# Patient Record
Sex: Male | Born: 1950
Health system: Southern US, Community
[De-identification: ages and names within clinical notes are randomized; demographics above are authoritative.]

## PROBLEM LIST (undated history)

## (undated) DIAGNOSIS — N529 Male erectile dysfunction, unspecified: Secondary | ICD-10-CM

## (undated) HISTORY — PX: TUMOR EXCISION: SHX421

## (undated) HISTORY — PX: HIP SURGERY: SHX245

## (undated) HISTORY — PX: TONSILLECTOMY: SUR1361

## (undated) HISTORY — PX: BACK SURGERY: SHX140

## (undated) HISTORY — PX: TOTAL HIP ARTHROPLASTY: SHX124

---

## 1997-09-12 ENCOUNTER — Encounter: Admission: RE | Admit: 1997-09-12 | Discharge: 1997-09-12 | Payer: Self-pay | Admitting: Family Medicine

## 2003-02-11 ENCOUNTER — Encounter: Admission: RE | Admit: 2003-02-11 | Discharge: 2003-02-27 | Payer: Self-pay | Admitting: Family Medicine

## 2008-06-27 ENCOUNTER — Encounter (INDEPENDENT_AMBULATORY_CARE_PROVIDER_SITE_OTHER): Payer: Self-pay | Admitting: *Deleted

## 2010-06-24 ENCOUNTER — Other Ambulatory Visit: Payer: Self-pay | Admitting: Dermatology

## 2011-04-15 ENCOUNTER — Other Ambulatory Visit: Payer: Self-pay | Admitting: Dermatology

## 2011-09-07 ENCOUNTER — Encounter (HOSPITAL_BASED_OUTPATIENT_CLINIC_OR_DEPARTMENT_OTHER): Payer: Self-pay | Admitting: *Deleted

## 2011-09-07 ENCOUNTER — Emergency Department (HOSPITAL_BASED_OUTPATIENT_CLINIC_OR_DEPARTMENT_OTHER)
Admission: EM | Admit: 2011-09-07 | Discharge: 2011-09-07 | Disposition: A | Discharge status: Home / Self Care | Payer: BC Managed Care – PPO | Attending: Emergency Medicine | Admitting: Emergency Medicine

## 2011-09-07 DIAGNOSIS — IMO0002 Reserved for concepts with insufficient information to code with codable children: Secondary | ICD-10-CM | POA: Insufficient documentation

## 2011-09-07 DIAGNOSIS — T148XXA Other injury of unspecified body region, initial encounter: Secondary | ICD-10-CM

## 2011-09-07 DIAGNOSIS — Z79899 Other long term (current) drug therapy: Secondary | ICD-10-CM | POA: Insufficient documentation

## 2011-09-07 DIAGNOSIS — J45909 Unspecified asthma, uncomplicated: Secondary | ICD-10-CM | POA: Insufficient documentation

## 2011-09-07 DIAGNOSIS — Y9373 Activity, racquet and hand sports: Secondary | ICD-10-CM | POA: Insufficient documentation

## 2011-09-07 DIAGNOSIS — Y9239 Other specified sports and athletic area as the place of occurrence of the external cause: Secondary | ICD-10-CM | POA: Insufficient documentation

## 2011-09-07 DIAGNOSIS — S0500XA Injury of conjunctiva and corneal abrasion without foreign body, unspecified eye, initial encounter: Secondary | ICD-10-CM

## 2011-09-07 DIAGNOSIS — S058X9A Other injuries of unspecified eye and orbit, initial encounter: Secondary | ICD-10-CM | POA: Insufficient documentation

## 2011-09-07 HISTORY — DX: Male erectile dysfunction, unspecified: N52.9

## 2011-09-07 MED ORDER — FLUORESCEIN SODIUM 1 MG OP STRP
ORAL_STRIP | OPHTHALMIC | Status: AC
  Administered 2011-09-07: 1
  Filled 2011-09-07: qty 1

## 2011-09-07 MED ORDER — CIPROFLOXACIN HCL 0.3 % OP SOLN
OPHTHALMIC | Status: DC
Start: 2011-09-07 — End: 2011-09-08
  Filled 2011-09-07: qty 2.5

## 2011-09-07 MED ORDER — TETRACAINE HCL 0.5 % OP SOLN
OPHTHALMIC | Status: AC
  Administered 2011-09-07: 21:00:00
  Filled 2011-09-07: qty 2

## 2011-09-07 MED ORDER — FLUORESCEIN SODIUM 1 MG OP STRP: 1.0000 | ORAL_STRIP | Freq: Once | OPHTHALMIC | Status: DC

## 2011-09-07 MED ORDER — TETRACAINE HCL 0.5 % OP SOLN: 2.0000 [drp] | Freq: Once | OPHTHALMIC | Status: DC

## 2011-09-07 MED ORDER — TETANUS-DIPHTH-ACELL PERTUSSIS 5-2.5-18.5 LF-MCG/0.5 IM SUSP
INTRAMUSCULAR | Status: AC
  Administered 2011-09-07: 0.5 mL via INTRAMUSCULAR
  Filled 2011-09-07: qty 0.5

## 2011-09-07 MED ORDER — CIPROFLOXACIN HCL 0.3 % OP SOLN
2.0000 [drp] | OPHTHALMIC | Status: DC
  Administered 2011-09-07: 2 [drp] via OPHTHALMIC

## 2011-09-07 NOTE — Discharge Instructions (Signed)
Corneal Abrasion The cornea is the clear covering at the front and center of the eye. It is a thin tissue made up of layers. The top layer is the most sensitive layer. A corneal abrasion happens if this layer is scratched or an injury causes it to come off.  HOME CARE  You may be given drops or a medicated cream. Use the medicine as told by your doctor.   A pressure patch may be put over the eye. If this is done, follow your doctor's instructions for when to remove the patch. Do not drive or use machines while the eye patch is on. Judging distances is hard to do with a patch on.   See your doctor for a follow-up exam if you are told to do so.  GET HELP RIGHT AWAY IF:   The pain is getting worse or is very bad.   The eye is very sensitive to light.   Any liquid comes out of the injured eye after treatment.   Your vision suddenly gets worse.   You have a sudden loss of vision or blindness.  MAKE SURE YOU:   Understand these instructions.   Will watch your condition.   Will get help right away if you are not doing well or get worse.  Document Released: 09/29/2007 Document Revised: 04/01/2011 Document Reviewed: 09/29/2007 [employer] Patient Information 2012 [employer]. 

## 2011-09-07 NOTE — ED Provider Notes (Signed)
History     CSN: [id number]  Arrival date & time 09/07/11  4098   First MD Initiated Contact with Patient 09/07/11 1955      Chief Complaint  Patient presents with  . Eye Injury    Left eye hit by raquet ball this day.    (Consider location/radiation/quality/duration/timing/severity/associated sxs/prior treatment) HPI Comments: Pt states that he was playing racquet ball and the ball hit his goggles and he initially felt like he had a scrape to the eye but it is starting to feel better:pt states that he has a history of optic neuritis and he is seeing at what is baseline is which isn't very much  Patient is a 61 y.o. male presenting with eye injury. The history is provided by the patient. No language interpreter was used.  Eye Injury This is a new problem. The current episode started today. The problem occurs constantly. The problem has been unchanged. The symptoms are aggravated by nothing. He has tried nothing for the symptoms.    Past Medical History  Diagnosis Date  . Erectile dysfunction   . Gout   . Asthma     Past Surgical History  Procedure Date  . 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 Tonsillectomy     History reviewed. No pertinent family history.  History  Substance Use Topics  . Smoking status: Never Smoker   . Smokeless tobacco: Not on file  . Alcohol Use: Yes      Review of Systems  Respiratory: Negative.   Cardiovascular: Negative.   Neurological: Negative.     Allergies  Review of patient's allergies indicates no known allergies.  Home Medications   Current Outpatient Rx  Name Route Sig Dispense Refill  . ALBUTEROL SULFATE HFA 108 (90 BASE) MCG/ACT IN AERS Inhalation Inhale 2 puffs into the lungs every 6 (six) hours as needed. For cough or shortness of breath    . ALLOPURINOL 100 MG PO TABS Oral Take 100 mg by mouth daily.    . COLCHICINE 0.6 MG PO TABS Oral Take 0.6 mg by mouth 2 (two) times daily as needed.  For gout flares    . ESOMEPRAZOLE MAGNESIUM 40 MG PO CPDR Oral Take 40 mg by mouth daily.    Marland Kitchen FLUTICASONE PROPIONATE 50 MCG/ACT NA SUSP Nasal Place 2 sprays into the nose daily.    Marland Kitchen FLUTICASONE-SALMETEROL 100-50 MCG/DOSE IN AEPB Inhalation Inhale 1 puff into the lungs every 12 (twelve) hours.    Marland Kitchen GLUCOSAMINE CHONDR COMPLEX PO Oral Take 2 tablets by mouth at bedtime.    . IBUPROFEN 200 MG PO TABS Oral Take 600-800 mg by mouth once as needed. For pain    . ADULT MULTIVITAMIN W/MINERALS CH Oral Take 1 tablet by mouth daily.    Marland Kitchen FISH OIL 1200 MG PO CAPS Oral Take 3 capsules by mouth at bedtime.    Marland Kitchen SILDENAFIL CITRATE 25 MG PO TABS Oral Take 25 mg by mouth daily as needed.    Marland Kitchen VITAMIN E PO Oral Take 1 capsule by mouth at bedtime.      BP 161/71  Pulse 84  Temp(Src) 98.1 F (36.7 C) (Oral)  Resp 20  Ht 6\' 2"  (1.88 m)  Wt 240 lb (108.863 kg)  BMI 30.81 kg/m2  SpO2 97%  Physical Exam  Nursing note and vitals reviewed. Constitutional: He appears well-developed and well-nourished.  HENT:  Right Ear: External ear normal.  Left Ear: External  ear normal.  Eyes: EOM are normal. Pupils are equal, round, and reactive to light. Left conjunctiva is injected.    Cardiovascular: Normal rate and regular rhythm.   Skin:       Pt has an abrasion to the left bridge of the nose and the left eye lid    ED Course  Procedures (including critical care time)  Labs Reviewed - No data to display No results found.   1. Corneal abrasion   2. Abrasion       MDM  Pt has no visual change from baseline:pt has abrasion:pt is not a contact wearer;pt given drops here with follow up instructions        Teressa Lower, NP 09/07/11 2044

## 2011-09-07 NOTE — ED Notes (Signed)
Pt. Reports optic neuritis in the L eye and he has "hardly any vision in the L eye".  Pt. Does wear glasses.

## 2011-09-07 NOTE — ED Provider Notes (Signed)
Medical screening examination/treatment/procedure(s) were performed by non-physician practitioner and as supervising physician I was immediately available for consultation/collaboration.   Matteus Mcnelly, MD 09/07/11 2209 

## 2013-08-15 ENCOUNTER — Encounter: Payer: Self-pay | Admitting: Internal Medicine

## 2013-12-04 ENCOUNTER — Other Ambulatory Visit: Payer: Self-pay | Admitting: Gastroenterology

## 2014-01-11 ENCOUNTER — Encounter: Payer: Self-pay | Admitting: Internal Medicine

## 2016-01-22 ENCOUNTER — Ambulatory Visit (INDEPENDENT_AMBULATORY_CARE_PROVIDER_SITE_OTHER): Payer: BLUE CROSS/BLUE SHIELD | Admitting: Podiatry

## 2016-01-22 ENCOUNTER — Encounter: Payer: Self-pay | Admitting: Podiatry

## 2016-01-22 ENCOUNTER — Ambulatory Visit (INDEPENDENT_AMBULATORY_CARE_PROVIDER_SITE_OTHER): Payer: BLUE CROSS/BLUE SHIELD

## 2016-01-22 VITALS — BP 177/103 | HR 73 | Resp 16 | Ht 74.0 in | Wt 260.0 lb

## 2016-01-22 DIAGNOSIS — M79671 Pain in right foot: Secondary | ICD-10-CM | POA: Diagnosis not present

## 2016-01-22 DIAGNOSIS — M1000 Idiopathic gout, unspecified site: Secondary | ICD-10-CM

## 2016-01-22 DIAGNOSIS — M2040 Other hammer toe(s) (acquired), unspecified foot: Secondary | ICD-10-CM

## 2016-01-22 DIAGNOSIS — M779 Enthesopathy, unspecified: Secondary | ICD-10-CM

## 2016-01-22 MED ORDER — METHYLPREDNISOLONE 4 MG PO TBPK: ORAL_TABLET | ORAL | 0 refills | Status: DC

## 2016-01-22 MED ORDER — TRIAMCINOLONE ACETONIDE 10 MG/ML IJ SUSP
10.0000 mg | Freq: Once | INTRAMUSCULAR | Status: AC
  Administered 2016-01-22: 10 mg

## 2016-01-22 NOTE — Progress Notes (Signed)
   Subjective:    Patient ID: Danny Soto, male    DOB: August 09, 1950, 65 y.o.   MRN: [id number]  HPI  Chief Complaint  Patient presents with  . Toe Pain    R great joint redness, swelling, warmth and pain x 5 days..."gout flair up."   . Toe Pain    R great toe "bone spur" sticking out and rubbing 2nd toe causing pain.       Review of Systems  All other systems reviewed and are negative.      Objective:   Physical Exam        Assessment & Plan:

## 2016-01-22 NOTE — Progress Notes (Signed)
Subjective:     Patient ID: Danny Soto, male   DOB: [birth year], 65 y.o.   MRN: [id number]  HPI patient states my right foot has become inflamed around the big toe joint and I have trouble off and on with my second toes with keratotic lesions that are gradually becoming more of a problem   Review of Systems  All other systems reviewed and are negative.      Objective:   Physical Exam  Constitutional: He is oriented to person, place, and time.  Cardiovascular: Intact distal pulses.   Musculoskeletal: Normal range of motion.  Neurological: He is oriented to person, place, and time.  Skin: Skin is warm.  Nursing note and vitals reviewed.  neurovascular status intact muscle strength adequate range of motion within normal limits with patient noted to have inflammation and pain around the first MPJ right with fluid buildup medial and reduced range of motion. There is deviation the hallux against the second toe bilateral with keratotic lesions between the hallux and second digit and patient is noted to have good digital perfusion and is well oriented 3     Assessment:     Structural changes first MPJ with hammertoe deformity and possibility for gout or other condition along with hallux limitus    Plan:     H&P and conditions reviewed with patient and today I went ahead and injected the first MPJ capsule 3 mg Kenalog 5 mill grams Xylocaine and educated on gout and hallux limitus deformity and also possibility for hammertoe correction and dispensed pads for the second digits bilateral and placed on Medrol Dosepak. Reappoint to recheck  X-ray report was negative for signs of fracture but did indicate some lysis or medial aspect first metatarsal head right and deviation of the hallux against the second toe

## 2016-01-26 ENCOUNTER — Ambulatory Visit: Payer: Self-pay | Admitting: Podiatry

## 2016-02-12 ENCOUNTER — Ambulatory Visit (INDEPENDENT_AMBULATORY_CARE_PROVIDER_SITE_OTHER): Payer: BLUE CROSS/BLUE SHIELD | Admitting: Podiatry

## 2016-02-12 ENCOUNTER — Encounter: Payer: Self-pay | Admitting: Podiatry

## 2016-02-12 DIAGNOSIS — M205X9 Other deformities of toe(s) (acquired), unspecified foot: Secondary | ICD-10-CM | POA: Diagnosis not present

## 2016-02-12 DIAGNOSIS — M2040 Other hammer toe(s) (acquired), unspecified foot: Secondary | ICD-10-CM

## 2016-02-12 DIAGNOSIS — M779 Enthesopathy, unspecified: Secondary | ICD-10-CM | POA: Diagnosis not present

## 2016-02-12 NOTE — Progress Notes (Signed)
Subjective:     Patient ID: Danny Soto, male   DOB: 12-16-1950, 65 y.o.   MRN: [id number]  HPI patient states that what was done for him has helped quite a bit but he does have chronic problems with the big toe joint right over left   Review of Systems     Objective:   Physical Exam Neurovascular status intact with continued reduction of range of motion of the significant nature right over left with inflammatory changes consistent around the joint surface with(discomfort that has reduced quite well    Assessment:     Inflammatory hallux limitus with capsulitis with probability also of gout as precipitating factor    Plan:     H&P conditions reviewed and at this time I went ahead and I discussed long-term orthotics to try to provide support and hopefully prevent surgery. Patient is placed in orthotics at the current time and was scanned and will be seen back when ready and we are making a Berkley type device with deep heel seat

## 2016-03-11 ENCOUNTER — Encounter: Payer: BLUE CROSS/BLUE SHIELD | Admitting: Podiatry

## 2016-04-28 ENCOUNTER — Ambulatory Visit: Payer: BLUE CROSS/BLUE SHIELD

## 2016-04-28 DIAGNOSIS — M779 Enthesopathy, unspecified: Secondary | ICD-10-CM

## 2016-04-28 NOTE — Patient Instructions (Signed)

## 2018-04-14 ENCOUNTER — Other Ambulatory Visit: Payer: Self-pay | Admitting: Podiatry

## 2018-04-14 ENCOUNTER — Ambulatory Visit: Payer: BLUE CROSS/BLUE SHIELD | Admitting: Podiatry

## 2018-04-14 ENCOUNTER — Encounter: Payer: Self-pay | Admitting: Podiatry

## 2018-04-14 ENCOUNTER — Ambulatory Visit (INDEPENDENT_AMBULATORY_CARE_PROVIDER_SITE_OTHER): Payer: BLUE CROSS/BLUE SHIELD

## 2018-04-14 DIAGNOSIS — M2012 Hallux valgus (acquired), left foot: Secondary | ICD-10-CM

## 2018-04-14 DIAGNOSIS — M79672 Pain in left foot: Secondary | ICD-10-CM

## 2018-04-14 DIAGNOSIS — M2042 Other hammer toe(s) (acquired), left foot: Secondary | ICD-10-CM | POA: Diagnosis not present

## 2018-04-14 DIAGNOSIS — L84 Corns and callosities: Secondary | ICD-10-CM | POA: Diagnosis not present

## 2018-04-14 DIAGNOSIS — M2010 Hallux valgus (acquired), unspecified foot: Secondary | ICD-10-CM | POA: Diagnosis not present

## 2018-04-14 MED ORDER — DOXYCYCLINE HYCLATE 100 MG PO TABS: 100.0000 mg | ORAL_TABLET | Freq: Two times a day (BID) | ORAL | 0 refills | Status: DC

## 2018-04-15 NOTE — Progress Notes (Signed)
Subjective:   Patient ID: Danny Soto, male   DOB: 67 y.o.   MRN: [id number]   HPI Patient presents with very painful lesion second digit left with structural changes of the big toe second toe with abutment of the hallux against the second digit.  Patient states is been present for about 4 months and he has been trying to soak it without relief and is been on an antibiotic which seemed to make a difference short-term.  Patient states it is very sore and he currently does not smoke and likes to be active   ROS      Objective:  Physical Exam  Neurovascular status intact muscle strength is adequate patient found to have keratotic lesion that does have moderate breakdown of tissue second digit left with significant abutment of the hallux and second toe with large spur formation that is painful when pressed.  Patient does have good digital perfusion is well oriented x3 with no proximal edema erythema or drainage noted     Assessment:  Chronic low-grade ulceration of the left second digit with structural bunion deformity hammertoe deformity noted and lesion on the big toe also     Plan:  H&P and education concerning condition along with x-rays reviewed with patient.  At this point I do think most likely this will require surgery with the way that this is acting but at this point I want to get this healthier before I would consider that.  I did go ahead today I applied a thick padding to the area to try to separate the 2 toes with a donut type device and I applied Iodosorb to dry this area out advised on soaks continued padding usage and placed on doxycycline for another 2 weeks.  Reappoint 3 weeks and if we can get this healthier we will consider bunion correction spur removal and arthroplasty procedure second digit  X-rays indicate that there is significant structural malalignment between the hallux and the second digit left with no indications of osteomyelitic changes

## 2018-05-04 ENCOUNTER — Ambulatory Visit: Payer: BLUE CROSS/BLUE SHIELD | Admitting: Podiatry

## 2018-05-04 ENCOUNTER — Ambulatory Visit (INDEPENDENT_AMBULATORY_CARE_PROVIDER_SITE_OTHER): Payer: BLUE CROSS/BLUE SHIELD

## 2018-05-04 ENCOUNTER — Encounter: Payer: Self-pay | Admitting: Podiatry

## 2018-05-04 ENCOUNTER — Other Ambulatory Visit: Payer: Self-pay | Admitting: Podiatry

## 2018-05-04 DIAGNOSIS — M2042 Other hammer toe(s) (acquired), left foot: Secondary | ICD-10-CM

## 2018-05-04 DIAGNOSIS — M2010 Hallux valgus (acquired), unspecified foot: Secondary | ICD-10-CM

## 2018-05-04 DIAGNOSIS — M79672 Pain in left foot: Secondary | ICD-10-CM

## 2018-05-04 DIAGNOSIS — D169 Benign neoplasm of bone and articular cartilage, unspecified: Secondary | ICD-10-CM

## 2018-05-04 MED ORDER — DOXYCYCLINE HYCLATE 100 MG PO TABS: 100.0000 mg | ORAL_TABLET | Freq: Two times a day (BID) | ORAL | 0 refills | Status: DC

## 2018-05-04 NOTE — Patient Instructions (Signed)
Pre-Operative Instructions  Congratulations, you have decided to take an important step towards improving your quality of life.  You can be assured that the doctors and staff at [facility] will be with you every step of the way.  Here are some important things you should know:  1. Plan to be at the surgery center/hospital at least 1 (one) hour prior to your scheduled time, unless otherwise directed by the surgical center/hospital staff.  You must have a responsible adult accompany you, remain during the surgery and drive you home.  Make sure you have directions to the surgical center/hospital to ensure you arrive on time. 2. If you are having surgery at [facility] or [place] [facility] hospitals, you will need a copy of your medical history and physical form from your family physician within one month prior to the date of surgery. We will give you a form for your primary physician to complete.  3. We make every effort to accommodate the date you request for surgery.  However, there are times where surgery dates or times have to be moved.  We will contact you as soon as possible if a change in schedule is required.   4. No aspirin/ibuprofen for one week before surgery.  If you are on aspirin, any non-steroidal anti-inflammatory medications (Mobic, Aleve, Ibuprofen) should not be taken seven (7) days prior to your surgery.  You make take Tylenol for pain prior to surgery.  5. Medications - If you are taking daily heart and blood pressure medications, seizure, reflux, allergy, asthma, anxiety, pain or diabetes medications, make sure you notify the surgery center/hospital before the day of surgery so they can tell you which medications you should take or avoid the day of surgery. 6. No food or drink after midnight the night before surgery unless directed otherwise by surgical center/hospital staff. 7. No alcoholic beverages 24-hours prior to surgery.  No smoking 24-hours prior or 24-hours after  surgery. 8. Wear loose pants or shorts. They should be loose enough to fit over bandages, boots, and casts. 9. Don't wear slip-on shoes. Sneakers are preferred. 10. Bring your boot with you to the surgery center/hospital.  Also bring crutches or a walker if your physician has prescribed it for you.  If you do not have this equipment, it will be provided for you after surgery. 11. If you have not been contacted by the surgery center/hospital by the day before your surgery, call to confirm the date and time of your surgery. 12. Leave-time from work may vary depending on the type of surgery you have.  Appropriate arrangements should be made prior to surgery with your employer. 13. Prescriptions will be provided immediately following surgery by your doctor.  Fill these as soon as possible after surgery and take the medication as directed. Pain medications will not be refilled on weekends and must be approved by the doctor. 14. Remove nail polish on the operative foot and avoid getting pedicures prior to surgery. 15. Wash the night before surgery.  The night before surgery wash the foot and leg well with water and the antibacterial soap provided. Be sure to pay special attention to beneath the toenails and in between the toes.  Wash for at least three (3) minutes. Rinse thoroughly with water and dry well with a towel.  Perform this wash unless told not to do so by your physician.  Enclosed: 1 Ice pack (please put in freezer the night before surgery)   1 Hibiclens skin cleaner     Pre-op instructions  If you have any questions regarding the instructions, please do not hesitate to call our office.  [place]: [address], [place], [place] [address] -- [phone]  [place]: [address] , [place] [address] -- [phone]  [place]: [address]  [place], [place] [address] -- [phone]  [place]: [address], [address], [place], [place] [address] -- [phone]  Website:  [email or web address] 

## 2018-05-04 NOTE — Progress Notes (Signed)
Subjective:   Patient ID: Danny Soto, male   DOB: 68 y.o.   MRN: [id number]   HPI Patient presents growing a spot between her big toe and second toe continues to not heal even though it is not red anymore or draining but it still painful and I do have this bunion and I know I need to get these corrected and it is been going on for fairly long time   ROS      Objective:  Physical Exam  Neurovascular status intact with patient found to have a continued open area on the medial side of the interphalangeal joint digit to left with structural bunion deformity with pressure between the hallux and second toe.  Patient does have good digital perfusion is well oriented x3 and has had this for over 5 months and we have done antibiotic treatment which is been effective in reducing any redness but it continues to be irritating and he wants it fixed     Assessment:  Hammertoe deformity left second toe with hypertrophy condyle left hallux and structural bunion deformity left with pressure between the 2 with chronic breakdown of tissue     Plan:  H&P conditions reviewed and at this point I do think surgical intervention is indicated.  I recommended arthroplasty along with condylectomy and structural bunion correction and I allowed him to read consent form going over all possible alternative treatments complications.  I did discuss there is absolutely no long-term guarantees that this area will heal and may ultimately require amputation of the second digit but this is the most likely way that we will build to get this better surgically and for the long-term.  He is completely understanding of this wants surgery signed consent form after extensive review and is scheduled for outpatient surgery and has air fracture walker dispensed with all instructions on usage.  Patient is encouraged to call us with any questions concerns he may have prior to procedure and I did dispense air fracture walker today and he  understands total recovery will be around 6 months  X-rays taken today does not indicate there is any ostial lysis or any indications of bone pathology of the second digit except for the chronic compression between the hallux and second toe

## 2018-05-05 ENCOUNTER — Telehealth: Payer: Self-pay | Admitting: *Deleted

## 2018-05-05 NOTE — Telephone Encounter (Signed)
Per Dr. Paulla Dolly, I attempted to call the patient and offer him January 14 for his surgery date.  I left him a message to call me back.  We presently have him scheduled for January 21.

## 2018-05-09 ENCOUNTER — Encounter: Payer: Self-pay | Admitting: Podiatry

## 2018-05-09 DIAGNOSIS — M25775 Osteophyte, left foot: Secondary | ICD-10-CM

## 2018-05-09 DIAGNOSIS — M2042 Other hammer toe(s) (acquired), left foot: Secondary | ICD-10-CM

## 2018-05-09 DIAGNOSIS — M2012 Hallux valgus (acquired), left foot: Secondary | ICD-10-CM

## 2018-05-16 NOTE — Telephone Encounter (Signed)
Danny Soto returned my call and we scheduled him for surgery on May 09, 2018.

## 2018-05-17 ENCOUNTER — Encounter: Payer: Self-pay | Admitting: Podiatry

## 2018-05-17 ENCOUNTER — Ambulatory Visit (INDEPENDENT_AMBULATORY_CARE_PROVIDER_SITE_OTHER): Payer: BLUE CROSS/BLUE SHIELD | Admitting: Podiatry

## 2018-05-17 ENCOUNTER — Ambulatory Visit (INDEPENDENT_AMBULATORY_CARE_PROVIDER_SITE_OTHER): Payer: BLUE CROSS/BLUE SHIELD

## 2018-05-17 DIAGNOSIS — M2010 Hallux valgus (acquired), unspecified foot: Secondary | ICD-10-CM

## 2018-05-17 DIAGNOSIS — M2012 Hallux valgus (acquired), left foot: Secondary | ICD-10-CM

## 2018-05-17 DIAGNOSIS — M2042 Other hammer toe(s) (acquired), left foot: Secondary | ICD-10-CM

## 2018-05-17 DIAGNOSIS — D169 Benign neoplasm of bone and articular cartilage, unspecified: Secondary | ICD-10-CM

## 2018-05-17 DIAGNOSIS — Z09 Encounter for follow-up examination after completed treatment for conditions other than malignant neoplasm: Secondary | ICD-10-CM

## 2018-05-17 NOTE — Progress Notes (Signed)
Subjective:   Patient ID: Danny Soto, male   DOB: 68 y.o.   MRN: [id number]   HPI Patient presents stating of doing very well with my left foot with minimal discomfort and unable to walk and I have been back at work   ROS      Objective:  Physical Exam  Neurovascular status intact with patient's left foot healing well with wound edges well coapted hallux in rectus position second digit good alignment with crusted tissue which I checked the toe appears to be healing very well with no redness drainage or erythema noted     Assessment:  Doing well post forefoot surgery left with a previous ulceration left second digit that appears to be healing well     Plan:  H&P x-rays reviewed and at this point I went ahead and reapplied sterile dressing instructed on continued elevation compression immobilization and reappoint 2 weeks of stitch removal or earlier if needed  X-ray indicates osteotomies healing very well digit in good alignment with satisfactory resection of condyle left hallux

## 2018-05-31 ENCOUNTER — Encounter: Payer: Self-pay | Admitting: Podiatry

## 2018-05-31 ENCOUNTER — Ambulatory Visit (INDEPENDENT_AMBULATORY_CARE_PROVIDER_SITE_OTHER): Payer: BLUE CROSS/BLUE SHIELD | Admitting: Podiatry

## 2018-05-31 ENCOUNTER — Ambulatory Visit (INDEPENDENT_AMBULATORY_CARE_PROVIDER_SITE_OTHER): Payer: BLUE CROSS/BLUE SHIELD

## 2018-05-31 DIAGNOSIS — M2010 Hallux valgus (acquired), unspecified foot: Secondary | ICD-10-CM

## 2018-05-31 DIAGNOSIS — D169 Benign neoplasm of bone and articular cartilage, unspecified: Secondary | ICD-10-CM | POA: Diagnosis not present

## 2018-05-31 DIAGNOSIS — M2042 Other hammer toe(s) (acquired), left foot: Secondary | ICD-10-CM

## 2018-05-31 DIAGNOSIS — Z09 Encounter for follow-up examination after completed treatment for conditions other than malignant neoplasm: Secondary | ICD-10-CM | POA: Diagnosis not present

## 2018-05-31 NOTE — Progress Notes (Signed)
This patient presents the office for an evaluation of his left foot following surgery by Dr. Paulla Dolly.    He says he is not having much pain or discomfort but his second toe does have soreness..   Patient says he needed surgery since he had continual breakdown of the skin second toe near the big toe.  Patient had an [place] bunionectomy 1st MPJ with kwire fixation, condylectomy left hallux and hammer toe second left foot.  Patient is walking with his surgical shoe.  Surgery was performed 3 weeks ago and his sutures were removed today.    Objective  Neurovascular status is intact.  Healing noted at the incision sites left foot.  Mild masceration noted along lateral dorsal incision.  No redness or drainage or infection from surgical sites. Good alignment of first ray.  Mild deviation noted second toe.  Ulcer on second toe is resolving in the absence of infection. Limited ROM 1st MPJ  Left.    S/P foot surgery  ROV.  X-rays reveal normal alignment with normal fixation 1st MPJ left.  Resection of hallux laterally is noted.  Absence of medial aspect base middle phalanx as well as head proximal phalanx second toe left foot.  Told patient to start moving big toe joint.  Call the office as needed.  RTC  2 weeks.  Continue using surgical shoe to ambulate.   Gardiner Barefoot DPM

## 2018-06-28 ENCOUNTER — Ambulatory Visit (INDEPENDENT_AMBULATORY_CARE_PROVIDER_SITE_OTHER): Payer: BLUE CROSS/BLUE SHIELD

## 2018-06-28 ENCOUNTER — Ambulatory Visit (INDEPENDENT_AMBULATORY_CARE_PROVIDER_SITE_OTHER): Payer: BLUE CROSS/BLUE SHIELD | Admitting: Podiatry

## 2018-06-28 ENCOUNTER — Encounter: Payer: Self-pay | Admitting: Podiatry

## 2018-06-28 DIAGNOSIS — M2042 Other hammer toe(s) (acquired), left foot: Secondary | ICD-10-CM | POA: Diagnosis not present

## 2018-06-28 DIAGNOSIS — M2010 Hallux valgus (acquired), unspecified foot: Secondary | ICD-10-CM

## 2018-06-28 NOTE — Progress Notes (Signed)
Subjective:   Patient ID: Danny Soto, male   DOB: 68 y.o.   MRN: [id number]   HPI Patient states doing real well with surgery with minimal discomfort and the lesion is gone   ROS      Objective:  Physical Exam  Neurovascular status intact with the second digit left healing well and structural bunion correction looking good     Assessment:  Doing well post foot surgery left     Plan:  H&P condition reviewed and recommended at this point gradual return to normal activities but continuing to protect it.  Reviewed x-ray with patient  X-ray indicates that the bunion is healing well digits in good alignment with satisfactory section above

## 2018-07-24 DIAGNOSIS — M47816 Spondylosis without myelopathy or radiculopathy, lumbar region: Secondary | ICD-10-CM | POA: Insufficient documentation

## 2018-07-24 DIAGNOSIS — M5136 Other intervertebral disc degeneration, lumbar region: Secondary | ICD-10-CM | POA: Insufficient documentation

## 2018-07-24 DIAGNOSIS — M4316 Spondylolisthesis, lumbar region: Secondary | ICD-10-CM | POA: Insufficient documentation

## 2018-07-24 DIAGNOSIS — M4155 Other secondary scoliosis, thoracolumbar region: Secondary | ICD-10-CM

## 2018-08-09 ENCOUNTER — Ambulatory Visit (INDEPENDENT_AMBULATORY_CARE_PROVIDER_SITE_OTHER): Payer: BLUE CROSS/BLUE SHIELD | Admitting: Podiatry

## 2018-08-09 ENCOUNTER — Encounter: Payer: Self-pay | Admitting: Podiatry

## 2018-08-09 ENCOUNTER — Other Ambulatory Visit: Payer: Self-pay

## 2018-08-09 ENCOUNTER — Ambulatory Visit (INDEPENDENT_AMBULATORY_CARE_PROVIDER_SITE_OTHER): Payer: BLUE CROSS/BLUE SHIELD

## 2018-08-09 VITALS — Temp 97.5°F

## 2018-08-09 DIAGNOSIS — M2042 Other hammer toe(s) (acquired), left foot: Secondary | ICD-10-CM

## 2018-08-09 DIAGNOSIS — M2010 Hallux valgus (acquired), unspecified foot: Secondary | ICD-10-CM | POA: Diagnosis not present

## 2018-08-09 NOTE — Progress Notes (Signed)
Subjective:   Patient ID: Danny Soto, male   DOB: 68 y.o.   MRN: [id number]   HPI Patient states doing well with left foot but concerned about the right foot and knows he needs to have this fixed but he wants to hold off and do it later in the year   ROS      Objective:  Physical Exam  Neurovascular status intact negative Homans sign noted left foot healing well wound edges well coapted hallux in rectus position satisfactory resection of bone with no reoccurrence of lesion with lesion noted second digit right and structural bunion deformity right     Assessment:  Doing well post surgical intervention left with patient requiring at one point osteotomy right digital fusion right and removal of spur from the right big toe     Plan:  H&P reviewed x-ray of left and at this point may return to normal activity left and patient wants to have surgery right but is can hold off at the current time.  Patient will be seen back for [place] to recheck  X-ray indicates that the osteotomy is healed well fixation in place joint congruence no signs of pathology

## 2018-11-15 DIAGNOSIS — M48061 Spinal stenosis, lumbar region without neurogenic claudication: Secondary | ICD-10-CM | POA: Insufficient documentation

## 2019-01-29 ENCOUNTER — Ambulatory Visit: Payer: BLUE CROSS/BLUE SHIELD | Admitting: Podiatry

## 2019-01-31 ENCOUNTER — Encounter: Payer: Self-pay | Admitting: Podiatry

## 2019-01-31 ENCOUNTER — Other Ambulatory Visit: Payer: Self-pay

## 2019-01-31 ENCOUNTER — Ambulatory Visit (INDEPENDENT_AMBULATORY_CARE_PROVIDER_SITE_OTHER): Payer: BC Managed Care – PPO

## 2019-01-31 ENCOUNTER — Ambulatory Visit: Payer: BC Managed Care – PPO | Admitting: Podiatry

## 2019-01-31 DIAGNOSIS — D169 Benign neoplasm of bone and articular cartilage, unspecified: Secondary | ICD-10-CM

## 2019-01-31 DIAGNOSIS — M2010 Hallux valgus (acquired), unspecified foot: Secondary | ICD-10-CM | POA: Diagnosis not present

## 2019-01-31 DIAGNOSIS — M2041 Other hammer toe(s) (acquired), right foot: Secondary | ICD-10-CM

## 2019-01-31 DIAGNOSIS — M2042 Other hammer toe(s) (acquired), left foot: Secondary | ICD-10-CM

## 2019-01-31 NOTE — Patient Instructions (Signed)
Pre-Operative Instructions  Congratulations, you have decided to take an important step towards improving your quality of life.  You can be assured that the doctors and staff at [facility] will be with you every step of the way.  Here are some important things you should know:  1. Plan to be at the surgery center/hospital at least 1 (one) hour prior to your scheduled time, unless otherwise directed by the surgical center/hospital staff.  You must have a responsible adult accompany you, remain during the surgery and drive you home.  Make sure you have directions to the surgical center/hospital to ensure you arrive on time. 2. If you are having surgery at [facility] or [facility], you will need a copy of your medical history and physical form from your family physician within one month prior to the date of surgery. We will give you a form for your primary physician to complete.  3. We make every effort to accommodate the date you request for surgery.  However, there are times where surgery dates or times have to be moved.  We will contact you as soon as possible if a change in schedule is required.   4. No aspirin/ibuprofen for one week before surgery.  If you are on aspirin, any non-steroidal anti-inflammatory medications (Mobic, Aleve, Ibuprofen) should not be taken seven (7) days prior to your surgery.  You make take Tylenol for pain prior to surgery.  5. Medications - If you are taking daily heart and blood pressure medications, seizure, reflux, allergy, asthma, anxiety, pain or diabetes medications, make sure you notify the surgery center/hospital before the day of surgery so they can tell you which medications you should take or avoid the day of surgery. 6. No food or drink after midnight the night before surgery unless directed otherwise by surgical center/hospital staff. 7. No alcoholic beverages 24-hours prior to surgery.  No smoking 24-hours prior or 24-hours after  surgery. 8. Wear loose pants or shorts. They should be loose enough to fit over bandages, boots, and casts. 9. Don't wear slip-on shoes. Sneakers are preferred. 10. Bring your boot with you to the surgery center/hospital.  Also bring crutches or a walker if your physician has prescribed it for you.  If you do not have this equipment, it will be provided for you after surgery. 11. If you have not been contacted by the surgery center/hospital by the day before your surgery, call to confirm the date and time of your surgery. 12. Leave-time from work may vary depending on the type of surgery you have.  Appropriate arrangements should be made prior to surgery with your employer. 13. Prescriptions will be provided immediately following surgery by your doctor.  Fill these as soon as possible after surgery and take the medication as directed. Pain medications will not be refilled on weekends and must be approved by the doctor. 14. Remove nail polish on the operative foot and avoid getting pedicures prior to surgery. 15. Wash the night before surgery.  The night before surgery wash the foot and leg well with water and the antibacterial soap provided. Be sure to pay special attention to beneath the toenails and in between the toes.  Wash for at least three (3) minutes. Rinse thoroughly with water and dry well with a towel.  Perform this wash unless told not to do so by your physician.  Enclosed: 1 Ice pack (please put in freezer the night before surgery)   1 Hibiclens skin cleaner     Pre-op instructions  If you have any questions regarding the instructions, please do not hesitate to call our office.  [place]: [address], [place], [place] [address] -- [phone]  [place]: [address] [place], [place] [address] -- [phone]  [place]: [address]  [place], [place] [address] -- [phone]   Website: [email or web address] 

## 2019-02-04 NOTE — Progress Notes (Signed)
Subjective:   Patient ID: Danny Soto, male   DOB: 68 y.o.   MRN: [id number]   HPI Patient states that he needs to have surgery on his right foot like the left and has excruciating pain between the big toe right toe like it had on the other foot   ROS      Objective:  Physical Exam  Neurovascular status intact with patient showing a significant lesion between the right hallux second toe with hyperostosis of the first metatarsal head right and lesion on the inside of the fifth digit right that is painful when pressed and make shoe gear walking difficult.  Patient is found to have good digital perfusion well oriented x3 with well corrected left foot     Assessment:  Structural bunion deformity right with deviation of the hallux against second toe with keratotic lesion digit to right and keratotic lesion on the inside digit 5 right that is painful     Plan:  H&P reviewed conditions and debridement accomplished today and discussed surgery and allowed him to read consent form after review of consent form alternative treatments complications.  He understands everything is listed signed consent form and that just because one foot did so well no guarantee the other one well and that all complications can occur.  Patient scheduled outpatient surgery and is encouraged to call with any questions concerns which may happen before surgery  X-rays indicate structural bunion deformity with deviation hallux again second toe right with deviation of the fifth digit right

## 2019-02-16 ENCOUNTER — Telehealth: Payer: Self-pay | Admitting: Podiatry

## 2019-02-16 NOTE — Telephone Encounter (Signed)
Pt called requesting to reschedule his surgery from 04/17/19 back a week to 04/10/19. I changed on Dr. Mellody Drown schedule in [place] and I will also change it in the book. I called Caren Griffins at [facility] and changed the surgery date with her as well.

## 2019-03-05 ENCOUNTER — Other Ambulatory Visit: Payer: Self-pay | Admitting: *Deleted

## 2019-03-05 DIAGNOSIS — Z20822 Contact with and (suspected) exposure to covid-19: Secondary | ICD-10-CM

## 2019-03-06 LAB — NOVEL CORONAVIRUS, NAA: SARS-CoV-2, NAA: NOT DETECTED

## 2019-03-16 ENCOUNTER — Telehealth: Payer: Self-pay | Admitting: Podiatry

## 2019-03-16 NOTE — Telephone Encounter (Signed)
DOS: 04/10/2019  SURGICAL PROCEDURES: Danny Soto [id number]), Hammertoe Repair 2nd Right and 5th Distal [phone]), Exostectomy 2nd and 5th [id number])  BCBS Policy Effective : 123XX123  -  04/25/9998  Member Liability Summary       In-Network   Max Per Benefit Period Year-to-Date Remaining     CoInsurance         Deductible $200.00 $0.00     Out-Of-Pocket 3 $2,000.00 $219.31 3 Out-of-Pocket includes copay, deductible, and coinsurance.  Hospital - [facility]      In-Network Copay Coinsurance Authorization Required Not Applicable 0%  No      In-Network Copay Coinsurance Authorization Required [id number]  Not Applicable No Messages: OUTPATIENT

## 2019-04-09 ENCOUNTER — Other Ambulatory Visit: Payer: Self-pay | Admitting: Podiatry

## 2019-04-09 MED ORDER — ONDANSETRON HCL 4 MG PO TABS: 4.0000 mg | ORAL_TABLET | Freq: Three times a day (TID) | ORAL | 0 refills | Status: DC | PRN

## 2019-04-09 MED ORDER — OXYCODONE-ACETAMINOPHEN 10-325 MG PO TABS: 1.0000 | ORAL_TABLET | Freq: Three times a day (TID) | ORAL | 0 refills | Status: AC | PRN

## 2019-04-10 ENCOUNTER — Encounter: Payer: Self-pay | Admitting: Podiatry

## 2019-04-10 DIAGNOSIS — M25774 Osteophyte, right foot: Secondary | ICD-10-CM | POA: Diagnosis not present

## 2019-04-10 DIAGNOSIS — M2041 Other hammer toe(s) (acquired), right foot: Secondary | ICD-10-CM | POA: Diagnosis not present

## 2019-04-10 DIAGNOSIS — M2011 Hallux valgus (acquired), right foot: Secondary | ICD-10-CM

## 2019-04-16 ENCOUNTER — Ambulatory Visit (INDEPENDENT_AMBULATORY_CARE_PROVIDER_SITE_OTHER): Payer: BC Managed Care – PPO

## 2019-04-16 ENCOUNTER — Other Ambulatory Visit: Payer: Self-pay

## 2019-04-16 ENCOUNTER — Encounter: Payer: Self-pay | Admitting: Podiatry

## 2019-04-16 ENCOUNTER — Ambulatory Visit (INDEPENDENT_AMBULATORY_CARE_PROVIDER_SITE_OTHER): Payer: BC Managed Care – PPO | Admitting: Podiatry

## 2019-04-16 VITALS — BP 132/71 | HR 82 | Temp 93.4°F | Resp 16

## 2019-04-16 DIAGNOSIS — M2041 Other hammer toe(s) (acquired), right foot: Secondary | ICD-10-CM

## 2019-04-18 NOTE — Progress Notes (Signed)
Subjective:   Patient ID: Danny Soto, male   DOB: 68 y.o.   MRN: [id number]   HPI Patient states doing well with right foot with patient stating that he is able to walk on it and is keeping it elevated and utilizing immobilization   ROS      Objective:  Physical Exam  Neurovascular status intact negative Bevelyn Buckles' sign noted patient's right foot is healing well wound edges well coapted digits in good alignment first metatarsal in good alignment with no drainage noted     Assessment:  Doing well post forefoot reconstruction right     Plan:  Reviewed condition recommended continuation of elevation compression immobilization and reapplied sterile dressing.  Reappoint 2 weeks or earlier if any issues were to occur  X-rays indicate that fixation looks good osteotomy looks good digital correction looks good currently

## 2019-04-30 ENCOUNTER — Ambulatory Visit (INDEPENDENT_AMBULATORY_CARE_PROVIDER_SITE_OTHER): Payer: BC Managed Care – PPO

## 2019-04-30 ENCOUNTER — Ambulatory Visit (INDEPENDENT_AMBULATORY_CARE_PROVIDER_SITE_OTHER): Payer: BC Managed Care – PPO | Admitting: Podiatry

## 2019-04-30 ENCOUNTER — Other Ambulatory Visit: Payer: Self-pay

## 2019-04-30 ENCOUNTER — Encounter: Payer: Self-pay | Admitting: Podiatry

## 2019-04-30 DIAGNOSIS — M2041 Other hammer toe(s) (acquired), right foot: Secondary | ICD-10-CM

## 2019-04-30 DIAGNOSIS — Z9889 Other specified postprocedural states: Secondary | ICD-10-CM

## 2019-04-30 NOTE — Progress Notes (Signed)
Subjective:   Patient ID: Danny Soto, male   DOB: 69 y.o.   MRN: [id number]   HPI Patient presents stating doing very well with surgery very pleased with results   ROS      Objective:  Physical Exam  Neurovascular status intact negative Bevelyn Buckles' sign noted with patient having stitches intact second fifth digit right with good alignment noted and good range of motion first MPJ     Assessment:  Overall doing well post surgery right foot     Plan:  H&P reviewed condition and recommended the continuation of range of motion exercises stitches removed wound edges coapted well compression stocking applied and instructed on gradual return to soft shoe gear over the next couple weeks  X-rays indicate osteotomies healing well good alignment is noted with satisfactory resection of bone

## 2019-05-21 ENCOUNTER — Other Ambulatory Visit: Payer: Self-pay

## 2019-05-21 ENCOUNTER — Ambulatory Visit (INDEPENDENT_AMBULATORY_CARE_PROVIDER_SITE_OTHER): Payer: BC Managed Care – PPO

## 2019-05-21 ENCOUNTER — Encounter: Payer: Self-pay | Admitting: Podiatry

## 2019-05-21 ENCOUNTER — Ambulatory Visit (INDEPENDENT_AMBULATORY_CARE_PROVIDER_SITE_OTHER): Payer: BC Managed Care – PPO | Admitting: Podiatry

## 2019-05-21 VITALS — Temp 96.4°F

## 2019-05-21 DIAGNOSIS — M2041 Other hammer toe(s) (acquired), right foot: Secondary | ICD-10-CM

## 2019-05-22 NOTE — Progress Notes (Signed)
Subjective:   Patient ID: Danny Soto, male   DOB: 69 y.o.   MRN: [id number]   HPI Patient presents stating that the right foot is doing better the swelling is reducing and gradually he has been more active and admits he has been hiking which may have been a little bit earlier than we should have done   ROS      Objective:  Physical Exam  Neurovascular status intact negative Bevelyn Buckles' sign noted with patient's right foot wound edges well coapted mild forefoot swelling but localized with mild reduced range of motion first MPJ which is normal for him with no indications of lesions currently     Assessment:  Overall doing well with patient being a little bit more active than I had like to see for this early postop     Plan:  H&P reviewed conditions and the importance of being careful with his feet and today I went ahead and I did discuss there is a slight crack in the first metatarsal but I do think it will heal uneventfully and I just want him to be careful for the next few weeks.  Patient will be seen back to recheck  X-rays indicate that there is a crack in the distal osteotomy right but it appears to be holding at this position and we will just keep an eye on it and I encouraged him to continue immobilization currently

## 2019-07-02 ENCOUNTER — Encounter: Payer: BC Managed Care – PPO | Admitting: Podiatry

## 2019-07-04 ENCOUNTER — Encounter: Payer: Self-pay | Admitting: Podiatry

## 2019-07-04 ENCOUNTER — Other Ambulatory Visit: Payer: Self-pay

## 2019-07-04 ENCOUNTER — Ambulatory Visit (INDEPENDENT_AMBULATORY_CARE_PROVIDER_SITE_OTHER): Payer: BC Managed Care – PPO

## 2019-07-04 ENCOUNTER — Ambulatory Visit (INDEPENDENT_AMBULATORY_CARE_PROVIDER_SITE_OTHER): Payer: BC Managed Care – PPO | Admitting: Podiatry

## 2019-07-04 VITALS — Temp 97.9°F

## 2019-07-04 DIAGNOSIS — M2041 Other hammer toe(s) (acquired), right foot: Secondary | ICD-10-CM

## 2019-07-04 NOTE — Progress Notes (Signed)
Subjective:   Patient ID: Danny Soto, male   DOB: 69 y.o.   MRN: [id number]   HPI Patient states doing very well with surgery and very pleased with how things are progressing   ROS      Objective:  Physical Exam  Neurovascular status intact negative Bevelyn Buckles' sign noted patient's right foot healing well wound edges well coapted good alignment good range of motion no crepitus     Assessment:  Doing well post repair right foot and repair left foot of several months earlier     Plan:  H&P x-ray reviewed and allow patient to return to normal activity and patient is discharged will be seen back as needed  X-rays indicate that there has been a slight crack in the osteotomy but it is healing well at this position and everything else is healing well with good alignment

## 2020-01-24 ENCOUNTER — Ambulatory Visit (INDEPENDENT_AMBULATORY_CARE_PROVIDER_SITE_OTHER): Payer: BC Managed Care – PPO | Admitting: Podiatry

## 2020-01-24 ENCOUNTER — Ambulatory Visit (INDEPENDENT_AMBULATORY_CARE_PROVIDER_SITE_OTHER): Payer: BC Managed Care – PPO

## 2020-01-24 ENCOUNTER — Encounter: Payer: Self-pay | Admitting: Podiatry

## 2020-01-24 ENCOUNTER — Other Ambulatory Visit: Payer: Self-pay

## 2020-01-24 DIAGNOSIS — M2042 Other hammer toe(s) (acquired), left foot: Secondary | ICD-10-CM | POA: Diagnosis not present

## 2020-01-24 DIAGNOSIS — G629 Polyneuropathy, unspecified: Secondary | ICD-10-CM | POA: Diagnosis not present

## 2020-01-24 DIAGNOSIS — Z9889 Other specified postprocedural states: Secondary | ICD-10-CM | POA: Diagnosis not present

## 2020-01-24 DIAGNOSIS — M779 Enthesopathy, unspecified: Secondary | ICD-10-CM

## 2020-01-24 DIAGNOSIS — M2041 Other hammer toe(s) (acquired), right foot: Secondary | ICD-10-CM | POA: Diagnosis not present

## 2020-01-24 NOTE — Progress Notes (Signed)
Subjective:   Patient ID: Danny Soto, male   DOB: 69 y.o.   MRN: [id number]   HPI Patient presents stating his get some numbness around his big toe second toe of both feet and wanted to get it checked.  States it does not hurt he has no corn formation but he was concerned   ROS      Objective:  Physical Exam  Vascular status intact with patient sugar running in the mid 7.5 range A1c with what appears to be light numbness around the hallux and second digit both feet localized to this area.  I did not note any other pathology with mild restriction of motion first MPJ bilateral but no crepitus of the joint     Assessment:  Appears to be possible low-grade neuropathy which may do be related to elevation of sugars versus any kind of other pathology which I reviewed with him     Plan:  H&P x-rays reviewed all pathology reviewed including possibility for stenosis or other back conditions.  Today I went ahead and I want to start him on medication with alpha lipoic acid along with vitamin B's along with reduction of his A1c that I want him to work on along with increased exercise.  Patient will be seen back if symptoms persist over the next 4 to 6 months  X-rays indicate that there is good healing of the osteotomy sites fixation in place joint congruence no indications of bone pathology

## 2020-07-17 ENCOUNTER — Other Ambulatory Visit: Payer: Self-pay

## 2020-07-17 ENCOUNTER — Ambulatory Visit: Payer: 59 | Admitting: Podiatry

## 2020-07-17 ENCOUNTER — Encounter: Payer: Self-pay | Admitting: Podiatry

## 2020-07-17 ENCOUNTER — Ambulatory Visit (INDEPENDENT_AMBULATORY_CARE_PROVIDER_SITE_OTHER): Payer: 59

## 2020-07-17 DIAGNOSIS — M79671 Pain in right foot: Secondary | ICD-10-CM | POA: Diagnosis not present

## 2020-07-17 DIAGNOSIS — M79672 Pain in left foot: Secondary | ICD-10-CM

## 2020-07-17 DIAGNOSIS — G629 Polyneuropathy, unspecified: Secondary | ICD-10-CM

## 2020-07-20 NOTE — Progress Notes (Signed)
Subjective:   Patient ID: Danny Soto, male   DOB: 70 y.o.   MRN: [id number]   HPI Patient presents with concerns about numbness in his forefoot bilateral and feels like it is getting worse and things that we have recommended he does not think so far is making a difference   ROS      Objective:  Physical Exam  Neurovascular status shows no change from previous with well-healed surgical sites from previous surgery with adequate range of motion and lesions which is completely disappeared allowing return to normal activities.  Continued feeling of numbness in his right and left forefoot mostly centered on the medial side of the foot that is at the visit     Assessment:  Probability that this is some form of idiopathic condition and I do not believe will be pathological but cannot make that kind of judgment     Plan:  Spent a great deal of time going over this with him and at this point I do think that we could see a neurological consult with neurologist and have testing done also I could do nerve biopsies.  At this point we will get a do referral to neurologist please get results and decide what else

## 2020-09-18 ENCOUNTER — Other Ambulatory Visit (HOSPITAL_COMMUNITY): Payer: Self-pay | Admitting: Family Medicine

## 2020-10-07 ENCOUNTER — Other Ambulatory Visit: Payer: Self-pay

## 2020-10-07 ENCOUNTER — Ambulatory Visit (HOSPITAL_BASED_OUTPATIENT_CLINIC_OR_DEPARTMENT_OTHER)
Admission: RE | Admit: 2020-10-07 | Discharge: 2020-10-07 | Disposition: A | Discharge status: Home / Self Care | Payer: Self-pay | Source: Ambulatory Visit | Attending: Family Medicine | Admitting: Family Medicine

## 2020-10-07 DIAGNOSIS — R0600 Dyspnea, unspecified: Secondary | ICD-10-CM | POA: Insufficient documentation

## 2020-12-15 NOTE — Progress Notes (Deleted)
Cardiology Office Note   Date:  12/15/2020   ID:  Danny Soto, DOB 1950/06/17, MRN [id number]  PCP:  Cari Caraway, MD  Cardiologist:   Marixa Mellott [place], MD   No chief complaint on file.     History of Present Illness: Danny Soto is a 70 y.o. male who is seen at the request of Dr Addison Lank for evaluation of chest pain.     Past Medical History:  Diagnosis Date   Asthma    Erectile dysfunction    Gout     Past Surgical History:  Procedure Laterality Date   TONSILLECTOMY     TUMOR EXCISION     Pt. reports blood tumor removed from his back     Current Outpatient Medications  Medication Sig Dispense Refill   albuterol (PROVENTIL HFA;VENTOLIN HFA) 108 (90 BASE) MCG/ACT inhaler Inhale 2 puffs into the lungs every 6 (six) hours as needed. For cough or shortness of breath     allopurinol (ZYLOPRIM) 300 MG tablet      esomeprazole (NEXIUM) 40 MG capsule Take 40 mg by mouth daily.     fluticasone (FLONASE) 50 MCG/ACT nasal spray Place 2 sprays into the nose daily.     Fluticasone-Salmeterol (ADVAIR) 100-50 MCG/DOSE AEPB Inhale 1 puff into the lungs every 12 (twelve) hours.     Glucosamine-Chondroitin (GLUCOSAMINE CHONDR COMPLEX PO) Take 2 tablets by mouth at bedtime.     hydrochlorothiazide (HYDRODIURIL) 12.5 MG tablet TAKE 1 TABLET BY MOUTH EVERY DAY IN THE MORNING     ibuprofen (ADVIL,MOTRIN) 200 MG tablet Take 600-800 mg by mouth once as needed. For pain     levalbuterol (XOPENEX HFA) 45 MCG/ACT inhaler Inhale 2 puffs into the lungs every 4 (four) hours as needed.     lisinopril (PRINIVIL,ZESTRIL) 20 MG tablet Take 20 mg by mouth daily.     meloxicam (MOBIC) 15 MG tablet TAKE 1 TABLET BY MOUTH EVERY DAY     metFORMIN (GLUCOPHAGE-XR) 500 MG 24 hr tablet TAKE 1 TABLET BY MOUTH EVERY DAY IN THE EVENING WITH DINNER     Multiple Vitamin (MULITIVITAMIN WITH MINERALS) TABS Take 1 tablet by mouth daily.     Omega-3 Fatty Acids (FISH OIL) 1200 MG CAPS Take 3 capsules by mouth  at bedtime.     tadalafil (CIALIS) 10 MG tablet TAKE 1 TO 2 TABLETS 1 HR PRIOR TO SEXUAL INTERCOURSE TABLET ONCE A DAY(MAX '20MG'$  IN 24HRS ORALLY     valACYclovir (VALTREX) 1000 MG tablet TAKE 1 TABLET BY MOUTH THREE TIMES A DAY FOR 7 DAYS     VITAMIN E PO Take 1 capsule by mouth at bedtime.     No current facility-administered medications for this visit.    Allergies:   Patient has no known allergies.    Social History:  The patient  reports that he has never smoked. He has never used smokeless tobacco. He reports current alcohol use. He reports that he does not use drugs.   Family History:  The patient's ***family history is not on file.    ROS:  Please see the history of present illness.   Otherwise, review of systems are positive for {NONE DEFAULTED:18576}.   All other systems are reviewed and negative.    PHYSICAL EXAM: VS:  There were no vitals taken for this visit. , BMI There is no height or weight on file to calculate BMI. GEN: Well nourished, well developed, in no acute distress HEENT: normal Neck: no JVD, carotid  bruits, or masses Cardiac: ***RRR; no murmurs, rubs, or gallops,no edema  Respiratory:  clear to auscultation bilaterally, normal work of breathing GI: soft, nontender, nondistended, + BS MS: no deformity or atrophy Skin: warm and dry, no rash Neuro:  Strength and sensation are intact Psych: euthymic mood, full affect   EKG:  EKG {ACTION; IS/IS GI:087931 ordered today. The ekg ordered today demonstrates ***   Recent Labs: No results found for requested labs within last 8760 hours.    Lipid Panel No results found for: CHOL, TRIG, HDL, CHOLHDL, VLDL, LDLCALC, LDLDIRECT    Wt Readings from Last 3 Encounters:  01/22/16 260 lb (117.9 kg)  09/07/11 240 lb (108.9 kg)      Other studies Reviewed: Additional studies/ records that were reviewed today include: ***. Review of the above records demonstrates: ***   ASSESSMENT AND PLAN:  1.   ***   Current medicines are reviewed at length with the patient today.  The patient {ACTIONS; HAS/DOES NOT HAVE:19233} concerns regarding medicines.  The following changes have been made:  {PLAN; NO CHANGE:13088:s}  Labs/ tests ordered today include: *** No orders of the defined types were placed in this encounter.    Disposition:   FU with *** in {gen number [id number] {Days to years:10300}  Signed, Seferina Brokaw [place], MD  12/15/2020 9:30 AM    [facility] [address] [place], [place], [address] Phone [phone], Fax [phone]

## 2020-12-19 ENCOUNTER — Ambulatory Visit: Payer: Self-pay | Admitting: Cardiology

## 2021-02-25 ENCOUNTER — Ambulatory Visit: Payer: Self-pay | Admitting: Cardiology

## 2021-11-06 ENCOUNTER — Ambulatory Visit: Payer: Self-pay | Admitting: Physical Therapy

## 2021-11-09 ENCOUNTER — Ambulatory Visit: Payer: 59 | Attending: Sports Medicine

## 2021-11-09 ENCOUNTER — Ambulatory Visit: Payer: Self-pay

## 2021-11-09 ENCOUNTER — Other Ambulatory Visit: Payer: Self-pay

## 2021-11-09 DIAGNOSIS — R262 Difficulty in walking, not elsewhere classified: Secondary | ICD-10-CM | POA: Insufficient documentation

## 2021-11-09 DIAGNOSIS — M25552 Pain in left hip: Secondary | ICD-10-CM | POA: Insufficient documentation

## 2021-11-09 NOTE — Therapy (Signed)
OUTPATIENT PHYSICAL THERAPY LOWER EXTREMITY EVALUATION   Patient Name: Danny Soto MRN: [id number] DOB:1951-02-25, 71 y.o., male Today's Date: 11/09/2021   PT End of Session - 11/09/21 1401     Visit Number 1    Number of Visits 10    Date for PT Re-Evaluation 01/22/22    PT Start Time 1402    PT Stop Time 1440    PT Time Calculation (min) 38 min    Equipment Utilized During Treatment Other (comment)   Rolling walker   Activity Tolerance Patient tolerated treatment well    Behavior During Therapy WFL for tasks assessed/performed             Past Medical History:  Diagnosis Date   Asthma    Erectile dysfunction    Gout    Past Surgical History:  Procedure Laterality Date   TONSILLECTOMY     TUMOR EXCISION     Pt. reports blood tumor removed from his back   Patient Active Problem List   Diagnosis Date Noted   Spinal stenosis at L4-L5 level 11/15/2018   Degenerative disc disease, lumbar 07/24/2018   Scoliosis of thoracolumbar region due to degenerative disease of spine in adult 07/24/2018   Spondylolisthesis at L4-L5 level 07/24/2018   Spondylosis of lumbar spine 07/24/2018    PCP: Cari Caraway, MD  REFERRING PROVIDER: Becky Sax, MD  REFERRING DIAG: Pain in left hip; Unilateral primary osteoarthritis, left hip, Aftercare following joint replacement surgery; Presence of left artificial hip joint  THERAPY DIAG:  Pain in left hip  Difficulty in walking, not elsewhere classified  Rationale for Evaluation and Treatment Rehabilitation  ONSET DATE: 11/02/21  SUBJECTIVE:   SUBJECTIVE STATEMENT: Patient reports that he had a left total hip replacement on 11/02/21. He has been having some pain and difficulty sleeping. He has been using his walker since surgery. He was able to recall his posterior hip precautions and he is sleeping with a pillow between his knees to prevent crossing his legs.   PERTINENT HISTORY: Right THA  PAIN:  Are you having pain?  Yes: NPRS scale: 5/10 Pain location: Left lateral hip Pain description: aching and sore Aggravating factors: prolonged standing and walking (about 1-2 hours) Relieving factors: rest  PRECAUTIONS: Posterior hip  WEIGHT BEARING RESTRICTIONS No  FALLS:  Has patient fallen in last 6 months? No  LIVING ENVIRONMENT: Lives with: lives with their family Lives in: House/apartment Stairs:  Yes, but he does not have to go upstairs as his son stay's up there. "A couple steps onto his porch." With railings on both sides; step to pattern Has following equipment at home: Gilford Rile - 2 wheeled  OCCUPATION: Dance movement psychotherapist   PLOF: [place] return to racquetball, sleep better (waking up every 2-3 hours due to left hip)    OBJECTIVE:   PATIENT SURVEYS:  FOTO 41.14  COGNITION:  Overall cognitive status: Within functional limits for tasks assessed     SENSATION: Patient reports slight numbness along his right lateral hip  EDEMA:  Mild left hip and thigh edema   PALPATION: TTP: left TFL, gluteals, and piriformis  LOWER EXTREMITY ROM:  Active ROM Right eval Left eval  Hip flexion WFL 60 degrees with groin hip   Hip extension    Hip abduction    Hip adduction    Hip internal rotation    Hip external rotation    Knee flexion    Knee extension    Ankle dorsiflexion  Ankle plantarflexion    Ankle inversion    Ankle eversion     (Blank rows = not tested)  FUNCTIONAL TESTS:  Required UE assistance for LLE mobility with sit to supine transfer  GAIT: Assistive device utilized: Environmental consultant - 2 wheeled Level of assistance: Modified independence Comments: decreased gait speed and stride length    TODAY'S TREATMENT:                                    EXERCISE LOG  Exercise Repetitions and Resistance Comments  Glute sets 15 reps w/ 5 second hold    Hamstring sets 15 reps w/ 5 second hold    Heel slides  Attempted, but held due to left hip and knee pain     Bent knee fall out 10 reps        Blank cell = exercise not performed today    PATIENT EDUCATION:  Education details: healing, prognosis, HEP Person educated: Patient Education method: Explanation Education comprehension: verbalized understanding   HOME EXERCISE PROGRAM: [id number]  ASSESSMENT:  CLINICAL IMPRESSION: Patient is a 71 y.o. male who was seen today for physical therapy evaluation and treatment following a left THA with a posterior approach on 11/02/21. He presented with moderate pain and severity with left hip AROM reproducing his familiar pain. He was provided a HEP which he was able to properly demonstrate with minimal cueing. Recommend that he continue with skilled physical therapy to address his remaining impairments to return to his prior level of function.     OBJECTIVE IMPAIRMENTS Abnormal gait, decreased activity tolerance, decreased mobility, difficulty walking, decreased ROM, decreased strength, hypomobility, impaired flexibility, and pain.   ACTIVITY LIMITATIONS carrying, lifting, bending, standing, squatting, sleeping, stairs, transfers, bed mobility, dressing, hygiene/grooming, locomotion level, and caring for others  PARTICIPATION LIMITATIONS: meal prep, cleaning, driving, shopping, community activity, and yard work  PERSONAL FACTORS Transportation and 1-2 comorbidities: DM and COPD  are also affecting patient's functional outcome.   REHAB POTENTIAL: Good  CLINICAL DECISION MAKING: Stable/uncomplicated  EVALUATION COMPLEXITY: Low   GOALS: Goals reviewed with patient? Yes  SHORT TERM GOALS: Target date: 11/30/2021  Patient will be independent with his initial HEP.  Baseline: Goal status: INITIAL  2.  Patient will be able to walk at least 60 feet without an assistive device for improved household mobility.  Baseline:  Goal status: INITIAL  LONG TERM GOALS: Target date: 12/21/2021   Patient will be independent with his advanced HEP.  Baseline:   Goal status: INITIAL  2.  Patient will be able to navigate stairs with a reciprocal gait pattern for improved household mobility.  Baseline:  Goal status: INITIAL  3.  Patient will be able to demonstrate at least 90 degrees of active left hip flexion for improved hip mobility.  Baseline:  Goal status: INITIAL  4.  Patient will be able to complete his daily activities without his familiar hip pain exceeding 3/10.  Baseline:  Goal status: INITIAL  5.  Patient will report being able to sleep at least 6 hours without being awakened by his familiar left hip pain.  Baseline:  Goal status: INITIAL   PLAN: PT FREQUENCY: 2x/week  PT DURATION: 6 weeks  PLANNED INTERVENTIONS: Therapeutic exercises, Therapeutic activity, Neuromuscular re-education, Balance training, Gait training, Patient/Family education, Self Care, Joint mobilization, Stair training, Electrical stimulation, Cryotherapy, Moist heat, Taping, Vasopneumatic device, Manual therapy, and Re-evaluation  PLAN FOR NEXT SESSION:  nustep, hip isometrics, heel slides, LAQ, and modalities as needed    Darlin Coco, PT 11/09/2021, 3:07 PM

## 2021-11-13 ENCOUNTER — Ambulatory Visit: Payer: 59

## 2021-11-13 DIAGNOSIS — M25552 Pain in left hip: Secondary | ICD-10-CM | POA: Diagnosis not present

## 2021-11-13 DIAGNOSIS — R262 Difficulty in walking, not elsewhere classified: Secondary | ICD-10-CM

## 2021-11-13 NOTE — Therapy (Signed)
OUTPATIENT PHYSICAL THERAPY LOWER EXTREMITY TREATMENT   Patient Name: Danny Soto MRN: [id number] DOB:1951/02/24, 71 y.o., male Today's Date: 11/13/2021   PT End of Session - 11/13/21 1036     Visit Number 2    Number of Visits 10    Date for PT Re-Evaluation 01/22/22    PT Start Time 1030    PT Stop Time 1127    PT Time Calculation (min) 57 min    Equipment Utilized During Treatment Other (comment)   Rolling walker   Activity Tolerance Patient tolerated treatment well    Behavior During Therapy WFL for tasks assessed/performed             Past Medical History:  Diagnosis Date   Asthma    Erectile dysfunction    Gout    Past Surgical History:  Procedure Laterality Date   TONSILLECTOMY     TUMOR EXCISION     Pt. reports blood tumor removed from his back   Patient Active Problem List   Diagnosis Date Noted   Spinal stenosis at L4-L5 level 11/15/2018   Degenerative disc disease, lumbar 07/24/2018   Scoliosis of thoracolumbar region due to degenerative disease of spine in adult 07/24/2018   Spondylolisthesis at L4-L5 level 07/24/2018   Spondylosis of lumbar spine 07/24/2018    PCP: Cari Caraway, MD  REFERRING PROVIDER: Becky Sax, MD  REFERRING DIAG: Pain in left hip; Unilateral primary osteoarthritis, left hip, Aftercare following joint replacement surgery; Presence of left artificial hip joint  THERAPY DIAG:  Pain in left hip  Difficulty in walking, not elsewhere classified  Rationale for Evaluation and Treatment Rehabilitation  ONSET DATE: 11/02/21  SUBJECTIVE:   SUBJECTIVE STATEMENT: Pt arrives for today's treatment session feeling good, reports that sleep is getting easier.   PERTINENT HISTORY: Right THA  PAIN:  Are you having pain? Yes: NPRS scale: 3/10 Pain location: Left lateral hip Pain description: aching and sore Aggravating factors: prolonged standing and walking (about 1-2 hours) Relieving factors: rest  PRECAUTIONS:  Posterior hip  WEIGHT BEARING RESTRICTIONS No  FALLS:  Has patient fallen in last 6 months? No  LIVING ENVIRONMENT: Lives with: lives with their family Lives in: House/apartment Stairs:  Yes, but he does not have to go upstairs as his son stay's up there. "A couple steps onto his porch." With railings on both sides; step to pattern Has following equipment at home: Gilford Rile - 2 wheeled  OCCUPATION: Dance movement psychotherapist   PLOF: [place] return to racquetball, sleep better (waking up every 2-3 hours due to left hip)    OBJECTIVE:   LOWER EXTREMITY ROM:  Active ROM Right eval Left eval  Hip flexion WFL 60 degrees with groin hip   Hip extension    Hip abduction    Hip adduction    Hip internal rotation    Hip external rotation    Knee flexion    Knee extension    Ankle dorsiflexion    Ankle plantarflexion    Ankle inversion    Ankle eversion     (Blank rows = not tested)     TODAY'S TREATMENT:                                    EXERCISE LOG  Exercise Repetitions and Resistance Comments  Nustep  Lvl 3 x 15 mins   LAQ to fatigue   Seated heel slides to  fatigue   Quad Sets 20 reps w/5 second hold   Glute sets 20 reps w/ 5 second hold    SLR 2 reps, stopped due to pain   Hamstring sets 20 reps w/ 5 second hold    SAQ 20 reps   Bent knee fall out 10 reps   Ball Squeeze 20 reps w 5 sec hold   Hip Abduction (supine) Yellow tband x 20 reps w 5 sec hold        Blank cell = exercise not performed today  Modalities  Date:  Unattended Estim: Hip, 80-150 Hz IFC, 15 mins, Pain and Tone Cold Pack: Hip, 15 mins, Pain and Edema     PATIENT EDUCATION:  Education details: healing, prognosis, HEP Person educated: Patient Education method: Explanation Education comprehension: verbalized understanding   HOME EXERCISE PROGRAM: [email or web address]  ASSESSMENT:  CLINICAL IMPRESSION: Pt arrives for today's treatment session reporting 3/10 left hip pain.  Pt  reports that he is still waking at least once in the night due to pain.  Pt able to tolerate increased reps with supine exercises today.  Pt only able to perform 2 SLR on left LE due to pain.  During both reps pt demonstrating minimal extensor lag.  Pt encouraged to continuation of HEP as previously instructed.  Normal responses to estim and ice pack upon removal.  Pt reported 3/10 left hip pain upon completion of today's treatment session.    OBJECTIVE IMPAIRMENTS Abnormal gait, decreased activity tolerance, decreased mobility, difficulty walking, decreased ROM, decreased strength, hypomobility, impaired flexibility, and pain.   ACTIVITY LIMITATIONS carrying, lifting, bending, standing, squatting, sleeping, stairs, transfers, bed mobility, dressing, hygiene/grooming, locomotion level, and caring for others  PARTICIPATION LIMITATIONS: meal prep, cleaning, driving, shopping, community activity, and yard work  PERSONAL FACTORS Transportation and 1-2 comorbidities: DM and COPD  are also affecting patient's functional outcome.   REHAB POTENTIAL: Good  CLINICAL DECISION MAKING: Stable/uncomplicated  EVALUATION COMPLEXITY: Low   GOALS: Goals reviewed with patient? Yes  SHORT TERM GOALS: Target date: 11/30/2021  Patient will be independent with his initial HEP.  Baseline: Goal status: INITIAL  2.  Patient will be able to walk at least 60 feet without an assistive device for improved household mobility.  Baseline:  Goal status: INITIAL  LONG TERM GOALS: Target date: 12/21/2021   Patient will be independent with his advanced HEP.  Baseline:  Goal status: INITIAL  2.  Patient will be able to navigate stairs with a reciprocal gait pattern for improved household mobility.  Baseline:  Goal status: INITIAL  3.  Patient will be able to demonstrate at least 90 degrees of active left hip flexion for improved hip mobility.  Baseline:  Goal status: INITIAL  4.  Patient will be able to complete  his daily activities without his familiar hip pain exceeding 3/10.  Baseline:  Goal status: INITIAL  5.  Patient will report being able to sleep at least 6 hours without being awakened by his familiar left hip pain.  Baseline:  Goal status: INITIAL   PLAN: PT FREQUENCY: 2x/week  PT DURATION: 6 weeks  PLANNED INTERVENTIONS: Therapeutic exercises, Therapeutic activity, Neuromuscular re-education, Balance training, Gait training, Patient/Family education, Self Care, Joint mobilization, Stair training, Electrical stimulation, Cryotherapy, Moist heat, Taping, Vasopneumatic device, Manual therapy, and Re-evaluation  PLAN FOR NEXT SESSION: nustep, hip isometrics, heel slides, LAQ, and modalities as needed    Kathrynn Ducking, PTA 11/13/2021, 11:32 AM

## 2021-11-16 ENCOUNTER — Ambulatory Visit: Payer: Self-pay

## 2021-11-17 ENCOUNTER — Ambulatory Visit: Payer: 59

## 2021-11-17 DIAGNOSIS — M25552 Pain in left hip: Secondary | ICD-10-CM | POA: Diagnosis not present

## 2021-11-17 DIAGNOSIS — R262 Difficulty in walking, not elsewhere classified: Secondary | ICD-10-CM

## 2021-11-17 NOTE — Therapy (Signed)
OUTPATIENT PHYSICAL THERAPY LOWER EXTREMITY TREATMENT   Patient Name: Danny Soto MRN: [id number] DOB:[birth year], 71 y.o., male Today's Date: 11/17/2021   PT End of Session - 11/17/21 1303     Visit Number 3    Number of Visits 10    Date for PT Re-Evaluation 01/22/22    PT Start Time 1300    PT Stop Time 1354    PT Time Calculation (min) 54 min    Equipment Utilized During Treatment Other (comment)   Rolling walker   Activity Tolerance Patient tolerated treatment well    Behavior During Therapy WFL for tasks assessed/performed              Past Medical History:  Diagnosis Date   Asthma    Erectile dysfunction    Gout    Past Surgical History:  Procedure Laterality Date   TONSILLECTOMY     TUMOR EXCISION     Pt. reports blood tumor removed from his back   Patient Active Problem List   Diagnosis Date Noted   Spinal stenosis at L4-L5 level 11/15/2018   Degenerative disc disease, lumbar 07/24/2018   Scoliosis of thoracolumbar region due to degenerative disease of spine in adult 07/24/2018   Spondylolisthesis at L4-L5 level 07/24/2018   Spondylosis of lumbar spine 07/24/2018    PCP: Cari Caraway, MD  REFERRING PROVIDER: Becky Sax, MD  REFERRING DIAG: Pain in left hip; Unilateral primary osteoarthritis, left hip, Aftercare following joint replacement surgery; Presence of left artificial hip joint  THERAPY DIAG:  Pain in left hip  Difficulty in walking, not elsewhere classified  Rationale for Evaluation and Treatment Rehabilitation  ONSET DATE: 11/02/21  SUBJECTIVE:   SUBJECTIVE STATEMENT: Patient reports that his hip is aching a little today, but he felt alright after his last appointment.   PERTINENT HISTORY: Right THA  PAIN:  Are you having pain? Yes: NPRS scale: 2-3/10 Pain location: Left lateral hip Pain description: aching Aggravating factors: prolonged standing and walking (about 1-2 hours) Relieving factors: rest  PRECAUTIONS:  Posterior hip  WEIGHT BEARING RESTRICTIONS No  FALLS:  Has patient fallen in last 6 months? No  LIVING ENVIRONMENT: Lives with: lives with their family Lives in: House/apartment Stairs:  Yes, but he does not have to go upstairs as his son stay's up there. "A couple steps onto his porch." With railings on both sides; step to pattern Has following equipment at home: Gilford Rile - 2 wheeled  OCCUPATION: Dance movement psychotherapist   PLOF: [place] return to racquetball, sleep better (waking up every 2-3 hours due to left hip)    OBJECTIVE:   LOWER EXTREMITY ROM:  Active ROM Right eval Left eval  Hip flexion WFL 60 degrees with groin hip   Hip extension    Hip abduction    Hip adduction    Hip internal rotation    Hip external rotation    Knee flexion    Knee extension    Ankle dorsiflexion    Ankle plantarflexion    Ankle inversion    Ankle eversion     (Blank rows = not tested)     TODAY'S TREATMENT:                                   7/25 EXERCISE LOG  Exercise Repetitions and Resistance Comments  Nustep L3 x 6 minutes Limited by fatigue  Hip adduction isometric Supine; 20 reps  w/ 5 second hold   Rocker board 3 minutes   Bridge w/ hip adduction isometric  15 reps    Marching Supine; 15 reps    Supine hip flexion AAROM; 15 reps   LAQ To fatigue With ice pack to left hip        Blank cell = exercise not performed today   Modalities  Date:  Cold Pack: Hip, 15 mins, Pain                                    7/21 EXERCISE LOG  Exercise Repetitions and Resistance Comments  Nustep  Lvl 3 x 15 mins   LAQ to fatigue   Seated heel slides to fatigue   Quad Sets 20 reps w/5 second hold   Glute sets 20 reps w/ 5 second hold    SLR 2 reps, stopped due to pain   Hamstring sets 20 reps w/ 5 second hold    SAQ 20 reps   Bent knee fall out 10 reps   Ball Squeeze 20 reps w 5 sec hold   Hip Abduction (supine) Yellow tband x 20 reps w 5 sec hold         Blank cell = exercise not performed today  Modalities  Date:  Unattended Estim: Hip, 80-150 Hz IFC, 15 mins, Pain and Tone Cold Pack: Hip, 15 mins, Pain and Edema     PATIENT EDUCATION:  Education details: healing, prognosis, HEP Person educated: Patient Education method: Explanation Education comprehension: verbalized understanding   HOME EXERCISE PROGRAM: [id number]  ASSESSMENT:  CLINICAL IMPRESSION: Patient presented to treatment with increased lower extremity fatigue which required multiple rest breaks throughout treatment. Fatigue and increased left hip adductor soreness limited his ability to complete today's new interventions which resulted in treatment focusing on familiar interventions. He required minimal cueing with these interventions for proper pacing and exercise performance. He reported that the ice at the conclusion of treatment helped to reduce his familiar soreness and fatigue at the conclusion of treatment. He continues to require skilled physical therapy to address his remaining impairments to return to his prior level of function.    OBJECTIVE IMPAIRMENTS Abnormal gait, decreased activity tolerance, decreased mobility, difficulty walking, decreased ROM, decreased strength, hypomobility, impaired flexibility, and pain.   ACTIVITY LIMITATIONS carrying, lifting, bending, standing, squatting, sleeping, stairs, transfers, bed mobility, dressing, hygiene/grooming, locomotion level, and caring for others  PARTICIPATION LIMITATIONS: meal prep, cleaning, driving, shopping, community activity, and yard work  PERSONAL FACTORS Transportation and 1-2 comorbidities: DM and COPD  are also affecting patient's functional outcome.   REHAB POTENTIAL: Good  CLINICAL DECISION MAKING: Stable/uncomplicated  EVALUATION COMPLEXITY: Low   GOALS: Goals reviewed with patient? Yes  SHORT TERM GOALS: Target date: 11/30/2021  Patient will be independent with his initial HEP.   Baseline: Goal status: INITIAL  2.  Patient will be able to walk at least 60 feet without an assistive device for improved household mobility.  Baseline:  Goal status: INITIAL  LONG TERM GOALS: Target date: 12/21/2021   Patient will be independent with his advanced HEP.  Baseline:  Goal status: INITIAL  2.  Patient will be able to navigate stairs with a reciprocal gait pattern for improved household mobility.  Baseline:  Goal status: INITIAL  3.  Patient will be able to demonstrate at least 90 degrees of active left hip flexion for improved hip mobility.  Baseline:  Goal status: INITIAL  4.  Patient will be able to complete his daily activities without his familiar hip pain exceeding 3/10.  Baseline:  Goal status: INITIAL  5.  Patient will report being able to sleep at least 6 hours without being awakened by his familiar left hip pain.  Baseline:  Goal status: INITIAL   PLAN: PT FREQUENCY: 2x/week  PT DURATION: 6 weeks  PLANNED INTERVENTIONS: Therapeutic exercises, Therapeutic activity, Neuromuscular re-education, Balance training, Gait training, Patient/Family education, Self Care, Joint mobilization, Stair training, Electrical stimulation, Cryotherapy, Moist heat, Taping, Vasopneumatic device, Manual therapy, and Re-evaluation  PLAN FOR NEXT SESSION: nustep, hip isometrics, heel slides, LAQ, and modalities as needed    Darlin Coco, PT 11/17/2021, 5:46 PM

## 2021-11-20 ENCOUNTER — Encounter: Payer: Self-pay | Admitting: *Deleted

## 2021-11-20 ENCOUNTER — Ambulatory Visit: Payer: 59 | Admitting: *Deleted

## 2021-11-20 DIAGNOSIS — M25552 Pain in left hip: Secondary | ICD-10-CM | POA: Diagnosis not present

## 2021-11-20 DIAGNOSIS — R262 Difficulty in walking, not elsewhere classified: Secondary | ICD-10-CM

## 2021-11-20 NOTE — Therapy (Signed)
OUTPATIENT PHYSICAL THERAPY LOWER EXTREMITY TREATMENT   Patient Name: Danny Soto MRN: [id number] DOB:1951-03-13, 71 y.o., male Today's Date: 11/20/2021   PT End of Session - 11/20/21 1121     Visit Number 4    Number of Visits 10    Date for PT Re-Evaluation 01/22/22    PT Start Time 1115    PT Stop Time 1206    PT Time Calculation (min) 51 min              Past Medical History:  Diagnosis Date   Asthma    Erectile dysfunction    Gout    Past Surgical History:  Procedure Laterality Date   TONSILLECTOMY     TUMOR EXCISION     Pt. reports blood tumor removed from his back   Patient Active Problem List   Diagnosis Date Noted   Spinal stenosis at L4-L5 level 11/15/2018   Degenerative disc disease, lumbar 07/24/2018   Scoliosis of thoracolumbar region due to degenerative disease of spine in adult 07/24/2018   Spondylolisthesis at L4-L5 level 07/24/2018   Spondylosis of lumbar spine 07/24/2018    PCP: Cari Caraway, MD  REFERRING PROVIDER: Becky Sax, MD  REFERRING DIAG: Pain in left hip; Unilateral primary osteoarthritis, left hip, Aftercare following joint replacement surgery; Presence of left artificial hip joint  THERAPY DIAG:  Pain in left hip  Difficulty in walking, not elsewhere classified  Rationale for Evaluation and Treatment Rehabilitation  ONSET DATE: 11/02/21  SUBJECTIVE:   SUBJECTIVE STATEMENT: Patient reports that his hip was aching a little after last Rx, but doing good today  PERTINENT HISTORY: Right THA  PAIN:  Are you having pain? Yes: NPRS scale: 2-3/10 Pain location: Left lateral hip Pain description: aching Aggravating factors: prolonged standing and walking (about 1-2 hours) Relieving factors: rest  PRECAUTIONS: Posterior hip  WEIGHT BEARING RESTRICTIONS No   OBJECTIVE:     TODAY'S TREATMENT:                                   7/28       EXERCISE LOG  Exercise Repetitions and Resistance Comments  Nustep L1  x 15 minutes   Hip adduction isometric Supine; 10 reps w/ 10 second hold   Rocker board 5 minutes PF/DF balance   Bridge w/ hip adduction isometric  10 reps  hold 5 secs   Marching Standing 3x10 reps    Heel ups/ toe ups x20   Supine hip flexion AAROM; 15 reps   LAQ  With ice pack to left hip        Blank cell = exercise not performed today   Modalities  Date:   Unattended Estim: Hip, 80-150 Hz IFC, 15 mins, Pain and Tone Cold Pack: Hip, 15 mins, Pain and Edema                                   7/21 EXERCISE LOG  Exercise Repetitions and Resistance Comments  Nustep  Lvl 3 x 15 mins   LAQ to fatigue   Seated heel slides to fatigue   Quad Sets 20 reps w/5 second hold   Glute sets 20 reps w/ 5 second hold    SLR 2 reps, stopped due to pain   Hamstring sets 20 reps w/ 5 second hold    SAQ 20  reps   Bent knee fall out 10 reps   Ball Squeeze 20 reps w 5 sec hold   Hip Abduction (supine) Yellow tband x 20 reps w 5 sec hold        Blank cell = exercise not performed today  Modalities  Date:  Unattended Estim: Hip, 80-150 Hz IFC, 15 mins, Pain and Tone Cold Pack: Hip, 15 mins, Pain and Edema     PATIENT EDUCATION:  Education details: healing, prognosis, HEP Person educated: Patient Education method: Explanation Education comprehension: verbalized understanding   HOME EXERCISE PROGRAM: [email or web address]  ASSESSMENT:  CLINICAL IMPRESSION: Pt arrived today doing fairly well with LT hip. He was able to continue and progress with hip exs in standing and supine as well as balance exs. IFC and ice x 15 mins to LT hip end of session.     OBJECTIVE IMPAIRMENTS Abnormal gait, decreased activity tolerance, decreased mobility, difficulty walking, decreased ROM, decreased strength, hypomobility, impaired flexibility, and pain.   ACTIVITY LIMITATIONS carrying, lifting, bending, standing, squatting, sleeping, stairs, transfers, bed mobility, dressing, hygiene/grooming, locomotion  level, and caring for others  PARTICIPATION LIMITATIONS: meal prep, cleaning, driving, shopping, community activity, and yard work  PERSONAL FACTORS Transportation and 1-2 comorbidities: DM and COPD  are also affecting patient's functional outcome.   REHAB POTENTIAL: Good  CLINICAL DECISION MAKING: Stable/uncomplicated  EVALUATION COMPLEXITY: Low   GOALS: Goals reviewed with patient? Yes  SHORT TERM GOALS: Target date: 11/30/2021  Patient will be independent with his initial HEP.  Baseline: Goal status: INITIAL  2.  Patient will be able to walk at least 60 feet without an assistive device for improved household mobility.  Baseline:  Goal status: INITIAL  LONG TERM GOALS: Target date: 12/21/2021   Patient will be independent with his advanced HEP.  Baseline:  Goal status: INITIAL  2.  Patient will be able to navigate stairs with a reciprocal gait pattern for improved household mobility.  Baseline:  Goal status: INITIAL  3.  Patient will be able to demonstrate at least 90 degrees of active left hip flexion for improved hip mobility.  Baseline:  Goal status: INITIAL  4.  Patient will be able to complete his daily activities without his familiar hip pain exceeding 3/10.  Baseline:  Goal status: INITIAL  5.  Patient will report being able to sleep at least 6 hours without being awakened by his familiar left hip pain.  Baseline:  Goal status: INITIAL   PLAN: PT FREQUENCY: 2x/week  PT DURATION: 6 weeks  PLANNED INTERVENTIONS: Therapeutic exercises, Therapeutic activity, Neuromuscular re-education, Balance training, Gait training, Patient/Family education, Self Care, Joint mobilization, Stair training, Electrical stimulation, Cryotherapy, Moist heat, Taping, Vasopneumatic device, Manual therapy, and Re-evaluation  PLAN FOR NEXT SESSION: nustep, hip isometrics, heel slides, LAQ, and modalities as needed    Courtne Lighty,CHRIS, PTA 11/20/2021, 12:19 PM

## 2021-11-24 ENCOUNTER — Ambulatory Visit: Payer: 59 | Attending: Sports Medicine

## 2021-11-24 DIAGNOSIS — R262 Difficulty in walking, not elsewhere classified: Secondary | ICD-10-CM | POA: Insufficient documentation

## 2021-11-24 DIAGNOSIS — M25552 Pain in left hip: Secondary | ICD-10-CM | POA: Diagnosis present

## 2021-11-24 NOTE — Therapy (Signed)
OUTPATIENT PHYSICAL THERAPY LOWER EXTREMITY TREATMENT   Patient Name: Danny Soto MRN: [id number] DOB:[birth year], 71 y.o., male Today's Date: 11/24/2021   PT End of Session - 11/24/21 1303     Visit Number 5    Number of Visits 10    Date for PT Re-Evaluation 01/22/22    PT Start Time 59              Past Medical History:  Diagnosis Date   Asthma    Erectile dysfunction    Gout    Past Surgical History:  Procedure Laterality Date   TONSILLECTOMY     TUMOR EXCISION     Pt. reports blood tumor removed from his back   Patient Active Problem List   Diagnosis Date Noted   Spinal stenosis at L4-L5 level 11/15/2018   Degenerative disc disease, lumbar 07/24/2018   Scoliosis of thoracolumbar region due to degenerative disease of spine in adult 07/24/2018   Spondylolisthesis at L4-L5 level 07/24/2018   Spondylosis of lumbar spine 07/24/2018    PCP: Cari Caraway, MD  REFERRING PROVIDER: Becky Sax, MD  REFERRING DIAG: Pain in left hip; Unilateral primary osteoarthritis, left hip, Aftercare following joint replacement surgery; Presence of left artificial hip joint  THERAPY DIAG:  Pain in left hip  Difficulty in walking, not elsewhere classified  Rationale for Evaluation and Treatment Rehabilitation  ONSET DATE: 11/02/21  SUBJECTIVE:   SUBJECTIVE STATEMENT: Pt reports mild left hip soreness today.  PERTINENT HISTORY: Right THA  PAIN:  Are you having pain? Yes: NPRS scale: 1-2/10 Pain location: Left lateral hip Pain description: aching Aggravating factors: prolonged standing and walking (about 1-2 hours) Relieving factors: rest  PRECAUTIONS: Posterior hip  WEIGHT BEARING RESTRICTIONS No   OBJECTIVE:     TODAY'S TREATMENT:   8/1       EXERCISE LOG  Exercise Repetitions and Resistance Comments  Bike L2 x 15 minutes   Standing Marching 3x10 reps   Standing Hip Abduction 2x10 reps   Standing Hip Extension 2x10 reps   Forward Step Ups 6  inch step, 2x10 reps   Rocker board 5 minutes PF/DF balance   Bridge w/ hip adduction isometric  20 reps  with 10#   Heel ups/ toe ups x20   Supine hip flexion AAROM; 15 reps   LAQ 4# 20 reps each With ice pack to left hip   SLR 12 reps    Blank cell = exercise not performed today   Modalities  Date:  Unattended Estim: Hip, IFC 80-150 Hz, 15 mins, Pain and Tone Cold Pack: Hip, 15 mins, Pain and Edema                                     7/28       EXERCISE LOG  Exercise Repetitions and Resistance Comments  Nustep L1 x 15 minutes   Hip adduction isometric Supine; 10 reps w/ 10 second hold   Rocker board 5 minutes PF/DF balance   Bridge w/ hip adduction isometric  10 reps  hold 5 secs   Marching Standing 3x10 reps    Heel ups/ toe ups x20   Supine hip flexion AAROM; 15 reps   LAQ  With ice pack to left hip        Blank cell = exercise not performed today   Modalities  Date:   Unattended Estim: Hip, 80-150 Hz IFC, 15  mins, Pain and Tone Cold Pack: Hip, 15 mins, Pain and Tone     PATIENT EDUCATION:  Education details: healing, prognosis, HEP Person educated: Patient Education method: Explanation Education comprehension: verbalized understanding   HOME EXERCISE PROGRAM: [email or web address]  ASSESSMENT:  CLINICAL IMPRESSION: Pt arrives for today's treatment session reporting 1-2/10 left hip pain.  Pt able to increase FOTO score to 63 today.  Pt able to tolerate transition to recumbent bike at seat 12 without issue.  Pt tolerate introduction to various standing exercises with min cues required for proper technique.  Pt challenged by weighted LAQ and supine straight leg raise today.  Normal responses to estim and ice pack noted upon removal.  Pt has made progress towards all of his goals at this time and has met a few of them.  Pt reported 0/10 left hip pain at completion of today's treatment session.      OBJECTIVE IMPAIRMENTS Abnormal gait, decreased activity tolerance,  decreased mobility, difficulty walking, decreased ROM, decreased strength, hypomobility, impaired flexibility, and pain.   ACTIVITY LIMITATIONS carrying, lifting, bending, standing, squatting, sleeping, stairs, transfers, bed mobility, dressing, hygiene/grooming, locomotion level, and caring for others  PARTICIPATION LIMITATIONS: meal prep, cleaning, driving, shopping, community activity, and yard work  PERSONAL FACTORS Transportation and 1-2 comorbidities: DM and COPD  are also affecting patient's functional outcome.   REHAB POTENTIAL: Good  CLINICAL DECISION MAKING: Stable/uncomplicated  EVALUATION COMPLEXITY: Low   GOALS: Goals reviewed with patient? Yes  SHORT TERM GOALS: Target date: 11/30/2021  Patient will be independent with his initial HEP.  Baseline: Goal status: MET  2.  Patient will be able to walk at least 60 feet without an assistive device for improved household mobility.  Baseline:  Goal status: MET  LONG TERM GOALS: Target date: 12/21/2021   Patient will be independent with his advanced HEP.  Baseline:  Goal status: IN PROGRESS  2.  Patient will be able to navigate stairs with a reciprocal gait pattern for improved household mobility.  Baseline:  Goal status: MET  3.  Patient will be able to demonstrate at least 90 degrees of active left hip flexion for improved hip mobility.  Baseline:  Goal status: IN PROGRESS  4.  Patient will be able to complete his daily activities without his familiar hip pain exceeding 3/10.  Baseline:  Goal status: IN PROGRESS  5.  Patient will report being able to sleep at least 6 hours without being awakened by his familiar left hip pain.  Baseline:  Goal status: MET   PLAN: PT FREQUENCY: 2x/week  PT DURATION: 6 weeks  PLANNED INTERVENTIONS: Therapeutic exercises, Therapeutic activity, Neuromuscular re-education, Balance training, Gait training, Patient/Family education, Self Care, Joint mobilization, Stair training,  Electrical stimulation, Cryotherapy, Moist heat, Taping, Vasopneumatic device, Manual therapy, and Re-evaluation  PLAN FOR NEXT SESSION: nustep, hip isometrics, heel slides, LAQ, and modalities as needed    Kathrynn Ducking, PTA 11/24/2021, 1:44 PM

## 2021-11-26 ENCOUNTER — Ambulatory Visit: Payer: 59

## 2021-11-26 DIAGNOSIS — M25552 Pain in left hip: Secondary | ICD-10-CM | POA: Diagnosis not present

## 2021-11-26 DIAGNOSIS — R262 Difficulty in walking, not elsewhere classified: Secondary | ICD-10-CM

## 2021-11-26 NOTE — Therapy (Addendum)
OUTPATIENT PHYSICAL THERAPY LOWER EXTREMITY TREATMENT   Patient Name: Danny Soto MRN: [id number] DOB:1950-08-13, 71 y.o., male Today's Date: 11/26/2021   PT End of Session - 11/26/21 1520     Visit Number 6    Number of Visits 10    Date for PT Re-Evaluation 01/22/22    PT Start Time 7628    PT Stop Time 1600    PT Time Calculation (min) 45 min    Activity Tolerance Patient tolerated treatment well    Behavior During Therapy WFL for tasks assessed/performed               Past Medical History:  Diagnosis Date   Asthma    Erectile dysfunction    Gout    Past Surgical History:  Procedure Laterality Date   TONSILLECTOMY     TUMOR EXCISION     Pt. reports blood tumor removed from his back   Patient Active Problem List   Diagnosis Date Noted   Spinal stenosis at L4-L5 level 11/15/2018   Degenerative disc disease, lumbar 07/24/2018   Scoliosis of thoracolumbar region due to degenerative disease of spine in adult 07/24/2018   Spondylolisthesis at L4-L5 level 07/24/2018   Spondylosis of lumbar spine 07/24/2018    PCP: Cari Caraway, MD  REFERRING PROVIDER: Becky Sax, MD  REFERRING DIAG: Pain in left hip; Unilateral primary osteoarthritis, left hip, Aftercare following joint replacement surgery; Presence of left artificial hip joint  THERAPY DIAG:  Pain in left hip  Difficulty in walking, not elsewhere classified  Rationale for Evaluation and Treatment Rehabilitation  ONSET DATE: 11/02/21  SUBJECTIVE:   SUBJECTIVE STATEMENT: Patient reports that he is sore today from going to the gym and exercising. However, this is not isolated to his hip.   PERTINENT HISTORY: Right THA  PAIN:  Are you having pain? Yes: NPRS scale: 1/10 Pain location: Left lateral hip Pain description: aching Aggravating factors: prolonged standing and walking (about 1-2 hours) Relieving factors: rest  PRECAUTIONS: Posterior hip  WEIGHT BEARING RESTRICTIONS  No   OBJECTIVE:    TODAY'S TREATMENT:                                   8/3 EXERCISE LOG  Exercise Repetitions and Resistance Comments  Recumbent bike L3 x 15 minutes   Leg press 5 plates x 30 reps; seat 9 Not exceeding 90 degrees of hip flexion  Cybex knee flexion  60# x 30 reps Not exceeding 90 degrees of hip flexion   Cybex knee extension 50# x 25 reps Limited by fatigue and soreness  Rocker board X 2 minutes   Marching on BOSU 2 x 1 minute   Tandem balance 4 x 30 seconds each    Eccentric heel tap 6" step x 20 reps each     Blank cell = exercise not performed today    8/1       EXERCISE LOG  Exercise Repetitions and Resistance Comments  Bike L2 x 15 minutes   Standing Marching 3x10 reps   Standing Hip Abduction 2x10 reps   Standing Hip Extension 2x10 reps   Forward Step Ups 6 inch step, 2x10 reps   Rocker board 5 minutes PF/DF balance   Bridge w/ hip adduction isometric  20 reps  with 10#   Heel ups/ toe ups x20   Supine hip flexion AAROM; 15 reps   LAQ 4# 20 reps each  With ice pack to left hip   SLR 12 reps    Blank cell = exercise not performed today   Modalities  Date:  Unattended Estim: Hip, IFC 80-150 Hz, 15 mins, Pain and Tone Cold Pack: Hip, 15 mins, Pain and Edema                                     7/28       EXERCISE LOG  Exercise Repetitions and Resistance Comments  Nustep L1 x 15 minutes   Hip adduction isometric Supine; 10 reps w/ 10 second hold   Rocker board 5 minutes PF/DF balance   Bridge w/ hip adduction isometric  10 reps  hold 5 secs   Marching Standing 3x10 reps    Heel ups/ toe ups x20   Supine hip flexion AAROM; 15 reps   LAQ  With ice pack to left hip        Blank cell = exercise not performed today   Modalities  Date:   Unattended Estim: Hip, 80-150 Hz IFC, 15 mins, Pain and Tone Cold Pack: Hip, 15 mins, Pain and Tone     PATIENT EDUCATION:  Education details: healing, prognosis, HEP Person educated: Patient Education  method: Explanation Education comprehension: verbalized understanding   HOME EXERCISE PROGRAM: [id number]  ASSESSMENT:  CLINICAL IMPRESSION: Patient was progressed with multiple new and familiar interventions for improved lower extremity strength and stability. He required minimal cueing with eccentric heel taps for improved eccentric quadriceps control for improved function descending stairs. Muscular fatigue was his primary limitation with today's interventions. He reported feeling tired upon the conclusion of treatment. He continues to require skilled physical therapy to address his remaining impairments to return to his prior level of function.   PHYSICAL THERAPY DISCHARGE SUMMARY  Visits from Start of Care: 6  Current functional level related to goals / functional outcomes: Patient was able to partially meet his goals for skilled physical therapy.    Remaining deficits: Pain and left hip AROM    Education / Equipment: HEP    Patient agrees to discharge. Patient goals were partially met. Patient is being discharged due to not returning since the last visit.   OBJECTIVE IMPAIRMENTS Abnormal gait, decreased activity tolerance, decreased mobility, difficulty walking, decreased ROM, decreased strength, hypomobility, impaired flexibility, and pain.   ACTIVITY LIMITATIONS carrying, lifting, bending, standing, squatting, sleeping, stairs, transfers, bed mobility, dressing, hygiene/grooming, locomotion level, and caring for others  PARTICIPATION LIMITATIONS: meal prep, cleaning, driving, shopping, community activity, and yard work  PERSONAL FACTORS Transportation and 1-2 comorbidities: DM and COPD  are also affecting patient's functional outcome.   REHAB POTENTIAL: Good  CLINICAL DECISION MAKING: Stable/uncomplicated  EVALUATION COMPLEXITY: Low   GOALS: Goals reviewed with patient? Yes  SHORT TERM GOALS: Target date: 11/30/2021  Patient will be independent with his initial HEP.   Baseline: Goal status: MET  2.  Patient will be able to walk at least 60 feet without an assistive device for improved household mobility.  Baseline:  Goal status: MET  LONG TERM GOALS: Target date: 12/21/2021   Patient will be independent with his advanced HEP.  Baseline:  Goal status: IN PROGRESS  2.  Patient will be able to navigate stairs with a reciprocal gait pattern for improved household mobility.  Baseline:  Goal status: MET  3.  Patient will be able to demonstrate at least 90 degrees of active  left hip flexion for improved hip mobility.  Baseline:  Goal status: IN PROGRESS  4.  Patient will be able to complete his daily activities without his familiar hip pain exceeding 3/10.  Baseline:  Goal status: IN PROGRESS  5.  Patient will report being able to sleep at least 6 hours without being awakened by his familiar left hip pain.  Baseline:  Goal status: MET   PLAN: PT FREQUENCY: 2x/week  PT DURATION: 6 weeks  PLANNED INTERVENTIONS: Therapeutic exercises, Therapeutic activity, Neuromuscular re-education, Balance training, Gait training, Patient/Family education, Self Care, Joint mobilization, Stair training, Electrical stimulation, Cryotherapy, Moist heat, Taping, Vasopneumatic device, Manual therapy, and Re-evaluation  PLAN FOR NEXT SESSION: nustep, hip isometrics, heel slides, LAQ, and modalities as needed    Darlin Coco, PT 11/26/2021, 4:06 PM

## 2021-12-01 ENCOUNTER — Encounter: Payer: 59 | Admitting: *Deleted

## 2021-12-03 ENCOUNTER — Encounter: Payer: 59 | Admitting: *Deleted

## 2022-02-19 ENCOUNTER — Ambulatory Visit: Payer: 59 | Admitting: Cardiology

## 2022-02-19 ENCOUNTER — Encounter: Payer: Self-pay | Admitting: Cardiology

## 2022-02-19 VITALS — BP 136/81 | HR 77 | Resp 16 | Ht 74.0 in | Wt 245.0 lb

## 2022-02-19 DIAGNOSIS — E782 Mixed hyperlipidemia: Secondary | ICD-10-CM

## 2022-02-19 DIAGNOSIS — N529 Male erectile dysfunction, unspecified: Secondary | ICD-10-CM

## 2022-02-19 DIAGNOSIS — I10 Essential (primary) hypertension: Secondary | ICD-10-CM

## 2022-02-19 DIAGNOSIS — E119 Type 2 diabetes mellitus without complications: Secondary | ICD-10-CM

## 2022-02-19 DIAGNOSIS — I358 Other nonrheumatic aortic valve disorders: Secondary | ICD-10-CM

## 2022-02-19 NOTE — Progress Notes (Signed)
ID:  Danny Soto, DOB [birth year], MRN [id number]  PCP:  Cari Caraway, MD  Cardiologist:  Rex Kras, DO, [facility] (established care 02/19/2022)  REASON FOR CONSULT: Aortic valve calcification  REQUESTING PHYSICIAN:  Cari Caraway, MD [place],  [place] [id number]  Chief Complaint  Patient presents with   New Patient (Initial Visit)    HPI  Danny Soto is a 71 y.o. Caucasian male who presents to the clinic for evaluation of Aortic valve calcification noted on screening coronary calcium report at the request of Cari Caraway, MD. His past medical history and cardiovascular risk factors include: Asthma, Gout, diabetes mellitus type 2, hypertension, hyperlipidemia, erectile dysfunction.   Patient was referred to the practice at the request of his PCP for evaluation of aortic valve calcification.  This was noted on a coronary calcium score study dated October 07, 2020.  His overall functional capacity remains relatively stable (see below).  When he had a coronary calcium score in June 2022 his total CAC was 1 placing him at the 20th percentile.  He has not followed up with cardiology in the past and no prior echo or stress test have been performed.  No episodes of near syncope, syncope, anginal discomfort, or heart failure symptoms.  FUNCTIONAL STATUS: Patient goes to the gym at least 3-4 times a week for 60 minutes.  He does the rowing machine for 15 minutes, followed by treadmill or cardio workout for another 15 minutes, and resistance training for the following 30 minutes.  Prior to May 2023 he was playing racquetball on a daily basis.  But after the hip replacement he has been predominantly at the gym.  ALLERGIES: No Known Allergies  MEDICATION LIST PRIOR TO VISIT: Current Meds  Medication Sig   albuterol (PROVENTIL HFA;VENTOLIN HFA) 108 (90 BASE) MCG/ACT inhaler Inhale 2 puffs into the lungs every 6 (six) hours as needed. For cough or shortness of breath    allopurinol (ZYLOPRIM) 300 MG tablet    esomeprazole (NEXIUM) 40 MG capsule Take 40 mg by mouth daily.   fluticasone (FLONASE) 50 MCG/ACT nasal spray Place 2 sprays into the nose daily.   Fluticasone-Salmeterol (ADVAIR) 100-50 MCG/DOSE AEPB Inhale 1 puff into the lungs every 12 (twelve) hours.   Glucosamine-Chondroitin (GLUCOSAMINE CHONDR COMPLEX PO) Take 2 tablets by mouth at bedtime.   hydrochlorothiazide (HYDRODIURIL) 25 MG tablet Take 25 mg by mouth daily.   ibuprofen (ADVIL,MOTRIN) 200 MG tablet Take 600-800 mg by mouth once as needed. For pain   JARDIANCE 10 MG TABS tablet Take 10 mg by mouth daily.   levalbuterol (XOPENEX HFA) 45 MCG/ACT inhaler Inhale 2 puffs into the lungs every 4 (four) hours as needed.   lisinopril (ZESTRIL) 40 MG tablet Take 40 mg by mouth daily.   meloxicam (MOBIC) 15 MG tablet TAKE 1 TABLET BY MOUTH EVERY DAY   metFORMIN (GLUCOPHAGE-XR) 500 MG 24 hr tablet TAKE 1 TABLET BY MOUTH EVERY DAY IN THE EVENING WITH DINNER   tadalafil (CIALIS) 10 MG tablet TAKE 1 TO 2 TABLETS 1 HR PRIOR TO SEXUAL INTERCOURSE TABLET ONCE A DAY(MAX '20MG'$  IN 24HRS ORALLY   valACYclovir (VALTREX) 1000 MG tablet TAKE 1 TABLET BY MOUTH THREE TIMES A DAY FOR 7 DAYS     PAST MEDICAL HISTORY: Past Medical History:  Diagnosis Date   Asthma    Erectile dysfunction    Gout     PAST SURGICAL HISTORY: Past Surgical History:  Procedure Laterality Date   BACK SURGERY  HIP SURGERY Right    TONSILLECTOMY     TOTAL HIP ARTHROPLASTY     TUMOR EXCISION     Pt. reports blood tumor removed from his back    FAMILY HISTORY: The patient family history includes Cancer in his brother and father; Heart disease in his father; Hypertension in his father.  SOCIAL HISTORY:  The patient  reports that he has never smoked. He has never used smokeless tobacco. He reports current alcohol use. He reports that he does not use drugs.  REVIEW OF SYSTEMS: Review of Systems  Cardiovascular:  Negative for  chest pain, claudication, dyspnea on exertion, irregular heartbeat, leg swelling, near-syncope, orthopnea, palpitations, paroxysmal nocturnal dyspnea and syncope.  Respiratory:  Negative for shortness of breath.   Hematologic/Lymphatic: Negative for bleeding problem.  Musculoskeletal:  Negative for muscle cramps and myalgias.  Neurological:  Negative for dizziness and light-headedness.    PHYSICAL EXAM:    02/19/2022   12:59 PM 04/16/2019    1:34 PM 01/22/2016    9:04 AM  Vitals with BMI  Height '6\' 2"'$   '6\' 2"'$   Weight 245 lbs  260 lbs  BMI 25.36  64.4  Systolic 034 742 595  Diastolic 81 71 638  Pulse 77 82 73   Physical Exam  Constitutional: No distress.  Age appropriate, hemodynamically stable.   Neck: No JVD present.  Cardiovascular: Normal rate, regular rhythm, S1 normal, S2 normal, intact distal pulses and normal pulses. Exam reveals no gallop, no S3 and no S4.  No murmur heard. Pulmonary/Chest: Effort normal and breath sounds normal. No stridor. He has no wheezes. He has no rales.  Abdominal: Soft. Bowel sounds are normal. He exhibits no distension. There is no abdominal tenderness.  Musculoskeletal:        General: No edema.     Cervical back: Neck supple.  Neurological: He is alert and oriented to person, place, and time. He has intact cranial nerves (2-12).  Skin: Skin is warm and moist.   CARDIAC DATABASE: EKG: 02/19/2022: Normal sinus rhythm, 78 bpm, left axis, left anterior fascicular block, without underlying ischemia injury pattern.  Echocardiogram: No results found for this or any previous visit from the past 1095 days.    Stress Testing: No results found for this or any previous visit from the past 1095 days.   Heart Catheterization: None  Coronary artery calcification scoring: 10/07/2020 Left main: 0 Left anterior descending artery: 1 Left circumflex artery: 0 Right coronary artery: 0   Total: 1   Percentile: 20th Pericardium: Normal. 1.  Coronary calcium score of 1. This was 20th percentile for age-race-, and sex-matched controls. 2.  AV calcium score: 458  LABORATORY DATA: External Labs: Collected: 09/28/2021 provided by referring physician. A1c 7.2. BUN 40, creatinine 1.48. Sodium 139, potassium 4.8, chloride 105, bicarb 28. AST 21, ALT 21, alkaline phosphatase 82. Total cholesterol 150, triglycerides 311, HDL 36, LDL 65, non-HDL 114  IMPRESSION:    ICD-10-CM   1. Aortic valve sclerosis  I35.8 PCV ECHOCARDIOGRAM COMPLETE    2. Primary hypertension  I10 EKG 12-Lead    3. Non-insulin dependent type 2 diabetes mellitus ([place])  E11.9     4. Mixed hyperlipidemia  E78.2     5. Erectile dysfunction, unspecified erectile dysfunction type  N52.9        RECOMMENDATIONS: GOLDMAN BIRCHALL is a 71 y.o. Caucasian male whose past medical history and cardiac risk factors include: Asthma, Gout, diabetes mellitus type 2, hypertension, hyperlipidemia, erectile dysfunction.  Patient was referred to the practice for evaluation of aortic valve calcification.  He had a coronary artery calcium score in June 2022 which noted a total CAC of 1, placing him at the 20th percentile.  In addition it noted a aortic valve score of 458 AU, not consistent with severe aortic stenosis.  In the interim he has had multiple noncardiac surgeries without any anesthesia complications.  His overall functional capacity remains relatively stable as documented above.  He denies anginal discomfort, near-syncope or syncope, or heart failure symptoms.  On physical examination no obvious murmur to suggest aortic stenosis.  Would like to proceed with an echocardiogram to evaluate for LVEF, diastolic function, and valvular heart disease.  I suspect that he will have aortic valve sclerosis without significant stenosis.  His overall functional capacity remains relatively stable despite multiple cardiovascular risk factors.  The shared decision between myself and  the patient was to hold off on stress test as he is currently asymptomatic otherwise.  Given his aortic valve sclerosis I have asked him to better control his LDL and triglycerides.  Would recommend considering checking LP(a) at the next blood draw for further risk stratification.  Would like to see him back after the echo was done to review the results and the plan of care going forward.  Outside records reviewed as part of today's office visit including progress notes, labs, today's EKG reviewed and independently reviewed, and additional testing ordered as documented below.   FINAL MEDICATION LIST END OF ENCOUNTER: No orders of the defined types were placed in this encounter.   Medications Discontinued During This Encounter  Medication Reason   Multiple Vitamin (MULITIVITAMIN WITH MINERALS) TABS    Omega-3 Fatty Acids (FISH OIL) 1200 MG CAPS    VITAMIN E PO      Current Outpatient Medications:    albuterol (PROVENTIL HFA;VENTOLIN HFA) 108 (90 BASE) MCG/ACT inhaler, Inhale 2 puffs into the lungs every 6 (six) hours as needed. For cough or shortness of breath, Disp: , Rfl:    allopurinol (ZYLOPRIM) 300 MG tablet, , Disp: , Rfl:    esomeprazole (NEXIUM) 40 MG capsule, Take 40 mg by mouth daily., Disp: , Rfl:    fluticasone (FLONASE) 50 MCG/ACT nasal spray, Place 2 sprays into the nose daily., Disp: , Rfl:    Fluticasone-Salmeterol (ADVAIR) 100-50 MCG/DOSE AEPB, Inhale 1 puff into the lungs every 12 (twelve) hours., Disp: , Rfl:    Glucosamine-Chondroitin (GLUCOSAMINE CHONDR COMPLEX PO), Take 2 tablets by mouth at bedtime., Disp: , Rfl:    hydrochlorothiazide (HYDRODIURIL) 25 MG tablet, Take 25 mg by mouth daily., Disp: , Rfl:    ibuprofen (ADVIL,MOTRIN) 200 MG tablet, Take 600-800 mg by mouth once as needed. For pain, Disp: , Rfl:    JARDIANCE 10 MG TABS tablet, Take 10 mg by mouth daily., Disp: , Rfl:    levalbuterol (XOPENEX HFA) 45 MCG/ACT inhaler, Inhale 2 puffs into the lungs every 4  (four) hours as needed., Disp: , Rfl:    lisinopril (ZESTRIL) 40 MG tablet, Take 40 mg by mouth daily., Disp: , Rfl:    meloxicam (MOBIC) 15 MG tablet, TAKE 1 TABLET BY MOUTH EVERY DAY, Disp: , Rfl:    metFORMIN (GLUCOPHAGE-XR) 500 MG 24 hr tablet, TAKE 1 TABLET BY MOUTH EVERY DAY IN THE EVENING WITH DINNER, Disp: , Rfl:    tadalafil (CIALIS) 10 MG tablet, TAKE 1 TO 2 TABLETS 1 HR PRIOR TO SEXUAL INTERCOURSE TABLET ONCE A DAY(MAX '20MG'$  IN 24HRS ORALLY,  Disp: , Rfl:    valACYclovir (VALTREX) 1000 MG tablet, TAKE 1 TABLET BY MOUTH THREE TIMES A DAY FOR 7 DAYS, Disp: , Rfl:   Orders Placed This Encounter  Procedures   EKG 12-Lead   PCV ECHOCARDIOGRAM COMPLETE    There are no Patient Instructions on file for this visit.   --Continue cardiac medications as reconciled in final medication list. --Return in about 3 months (around 05/22/2022) for Follow up aortic valve sclerosis, review echo.. or sooner if needed. --Continue follow-up with your primary care physician regarding the management of your other chronic comorbid conditions.  Patient's questions and concerns were addressed to his satisfaction. He voices understanding of the instructions provided during this encounter.   This note was created using a voice recognition software as a result there may be grammatical errors inadvertently enclosed that do not reflect the nature of this encounter. Every attempt is made to correct such errors.  Rex Kras, [place], [facility]  Pager: [phone] Office: [phone]

## 2022-04-21 ENCOUNTER — Other Ambulatory Visit: Payer: 59

## 2022-04-28 ENCOUNTER — Ambulatory Visit: Payer: 59

## 2022-04-28 DIAGNOSIS — I358 Other nonrheumatic aortic valve disorders: Secondary | ICD-10-CM

## 2022-05-13 ENCOUNTER — Ambulatory Visit: Payer: 59 | Admitting: Cardiology

## 2022-05-13 ENCOUNTER — Encounter: Payer: Self-pay | Admitting: Cardiology

## 2022-05-13 VITALS — BP 139/71 | HR 79 | Resp 18 | Ht 74.0 in | Wt 247.2 lb

## 2022-05-13 DIAGNOSIS — N529 Male erectile dysfunction, unspecified: Secondary | ICD-10-CM

## 2022-05-13 DIAGNOSIS — I358 Other nonrheumatic aortic valve disorders: Secondary | ICD-10-CM

## 2022-05-13 DIAGNOSIS — E782 Mixed hyperlipidemia: Secondary | ICD-10-CM

## 2022-05-13 DIAGNOSIS — I10 Essential (primary) hypertension: Secondary | ICD-10-CM

## 2022-05-13 DIAGNOSIS — E119 Type 2 diabetes mellitus without complications: Secondary | ICD-10-CM

## 2022-05-13 NOTE — Progress Notes (Signed)
ID:  Danny Soto, DOB Mar 12, 1951, MRN [id number]  PCP:  Cari Caraway, MD  Cardiologist:  Rex Kras, DO, [facility] (established care 02/19/2022)  Date: 05/13/22 Last Office Visit: 02/19/2022  Chief Complaint  Patient presents with   aortic valve sclerosis   Follow-up    3 month    HPI  Danny Soto is a 72 y.o. Caucasian male whose past medical history and cardiovascular risk factors include: Asthma, Gout, diabetes mellitus type 2, hypertension, hyperlipidemia, erectile dysfunction.   Patient was referred to the practice for evaluation of aortic valve calcification which was noted on a coronary calcium score study in June 2022.  At the last office visit in October 2023 this shared decision was to proceed with an echocardiogram for further evaluation.  At the last office visit he was recommended to have an echocardiogram to evaluate for LVEF, valvular heart disease, and regional wall motion.  He was also educated on the importance of better LDL and triglyceride management.  And checking LP(a) at his next blood draw with PCP.  Patient presents today for follow-up.Since last office visit he has not had any anginal discomfort or heart failure symptoms.  His physical activity level remains relatively stable.  No hospitalizations or urgent care visits since last office encounter.  FUNCTIONAL STATUS: Patient goes to the gym at least 3-4 times a week for 60 minutes.  He does the rowing machine for 15 minutes, followed by treadmill or cardio workout for another 15 minutes, and resistance training for the following 30 minutes.  ALLERGIES: Allergies  Allergen Reactions   Sulfa Antibiotics Rash    MEDICATION LIST PRIOR TO VISIT: Current Meds  Medication Sig   albuterol (PROVENTIL HFA;VENTOLIN HFA) 108 (90 BASE) MCG/ACT inhaler Inhale 2 puffs into the lungs every 6 (six) hours as needed. For cough or shortness of breath   allopurinol (ZYLOPRIM) 300 MG tablet    esomeprazole (NEXIUM)  40 MG capsule Take 40 mg by mouth daily.   fluticasone (FLONASE) 50 MCG/ACT nasal spray Place 2 sprays into the nose daily.   Fluticasone-Salmeterol (ADVAIR) 100-50 MCG/DOSE AEPB Inhale 1 puff into the lungs every 12 (twelve) hours.   Glucosamine-Chondroitin (GLUCOSAMINE CHONDR COMPLEX PO) Take 2 tablets by mouth at bedtime.   JARDIANCE 10 MG TABS tablet Take 10 mg by mouth daily.   levalbuterol (XOPENEX HFA) 45 MCG/ACT inhaler Inhale 2 puffs into the lungs every 4 (four) hours as needed.   lisinopril (ZESTRIL) 40 MG tablet Take 40 mg by mouth daily.   meloxicam (MOBIC) 15 MG tablet TAKE 1 TABLET BY MOUTH EVERY DAY   metFORMIN (GLUCOPHAGE-XR) 500 MG 24 hr tablet TAKE 1 TABLET BY MOUTH EVERY DAY IN THE EVENING WITH DINNER   tadalafil (CIALIS) 10 MG tablet TAKE 1 TO 2 TABLETS 1 HR PRIOR TO SEXUAL INTERCOURSE TABLET ONCE A DAY(MAX '20MG'$  IN 24HRS ORALLY     PAST MEDICAL HISTORY: Past Medical History:  Diagnosis Date   Asthma    Erectile dysfunction    Gout     PAST SURGICAL HISTORY: Past Surgical History:  Procedure Laterality Date   BACK SURGERY     HIP SURGERY Right    TONSILLECTOMY     TOTAL HIP ARTHROPLASTY     TUMOR EXCISION     Pt. reports blood tumor removed from his back    FAMILY HISTORY: The patient family history includes Cancer in his brother and father; Heart disease in his father; Hypertension in his father.  SOCIAL HISTORY:  The patient  reports that he has never smoked. He has never used smokeless tobacco. He reports current alcohol use. He reports that he does not use drugs.  REVIEW OF SYSTEMS: Review of Systems  Cardiovascular:  Negative for chest pain, claudication, dyspnea on exertion, irregular heartbeat, leg swelling, near-syncope, orthopnea, palpitations, paroxysmal nocturnal dyspnea and syncope.  Respiratory:  Negative for shortness of breath.   Hematologic/Lymphatic: Negative for bleeding problem.  Musculoskeletal:  Negative for muscle cramps and  myalgias.  Neurological:  Negative for dizziness and light-headedness.    PHYSICAL EXAM:    05/13/2022    1:32 PM 02/19/2022   12:59 PM 04/16/2019    1:34 PM  Vitals with BMI  Height '6\' 2"'$  '6\' 2"'$    Weight 247 lbs 3 oz 245 lbs   BMI 19.41 74.08   Systolic 144 818 563  Diastolic 71 81 71  Pulse 79 77 82   Physical Exam  Constitutional: No distress.  Age appropriate, hemodynamically stable.   Neck: No JVD present.  Cardiovascular: Normal rate, regular rhythm, S1 normal, S2 normal, intact distal pulses and normal pulses. Exam reveals no gallop, no S3 and no S4.  No murmur heard. Pulmonary/Chest: Effort normal and breath sounds normal. No stridor. He has no wheezes. He has no rales.  Abdominal: Soft. Bowel sounds are normal. He exhibits no distension. There is no abdominal tenderness.  Musculoskeletal:        General: No edema.     Cervical back: Neck supple.  Neurological: He is alert and oriented to person, place, and time. He has intact cranial nerves (2-12).  Skin: Skin is warm and moist.   CARDIAC DATABASE: EKG: 02/19/2022: Normal sinus rhythm, 78 bpm, left axis, left anterior fascicular block, without underlying ischemia injury pattern.  Echocardiogram: 04/28/2022: Normal LV systolic function with visual EF 60-65%. Left ventricle cavity is normal in size. Normal left ventricular wall thickness. Normal global wall motion. Normal diastolic filling pattern, normal LAP.  Native aortic valve. Trace aortic regurgitation. Aortic valve sclerosis without stenosis. The aortic root is upper limit of normal, 43m.  Proximal ascending aorta measure within normal limits. No prior study for comparison.   Coronary artery calcification scoring: 10/07/2020 Left main: 0 Left anterior descending artery: 1 Left circumflex artery: 0 Right coronary artery: 0   Total: 1   Percentile: 20th Pericardium: Normal. 1. Coronary calcium score of 1. This was 20th percentile for age-race-, and  sex-matched controls. 2.  AV calcium score: 458  LABORATORY DATA: External Labs: Collected: 09/28/2021 provided by referring physician. A1c 7.2. BUN 40, creatinine 1.48. Sodium 139, potassium 4.8, chloride 105, bicarb 28. AST 21, ALT 21, alkaline phosphatase 82. Total cholesterol 150, triglycerides 311, HDL 36, LDL 65, non-HDL 114  IMPRESSION:    ICD-10-CM   1. Aortic valve sclerosis  I35.8     2. Primary hypertension  I10     3. Non-insulin dependent type 2 diabetes mellitus ([place]  E11.9     4. Mixed hyperlipidemia  E78.2     5. Erectile dysfunction, unspecified erectile dysfunction type  N52.9        RECOMMENDATIONS: WMYKEL SPONAUGLEis a 72y.o. Caucasian male whose past medical history and cardiac risk factors include: Asthma, Gout, diabetes mellitus type 2, hypertension, hyperlipidemia, erectile dysfunction.   Referred to the practice due to aortic valve calcification score which was noted incidentally on a coronary calcium score from 2022.  His total coronary calcium score was 1 and aortic valve score was  458.  Just based on the numbers aortic valve score was not consistent with severe stenosis.  However given the clinical concern he did undergo an echocardiogram which we confirmed aortic valve sclerosis without stenosis.  His EF remains stable as well as his diastolic function.  I have educated him on the importance of better lipid management with a goal LDL of less than 55 mg/dL if obtainable and triglycerides less than 150 mg/dL.  His aortic root was reported to be 38 mm which is the upper limit of normal but given his height may be within acceptable limits when compared to BSA.  Given his age and other cardiovascular risk factors I would recommend follow-up in 3 years or sooner if change in clinical status.  Patient is agreeable with the plan of care.  I will defer him back to his PCP for management of his other chronic comorbid conditions.  FINAL MEDICATION LIST END OF  ENCOUNTER: No orders of the defined types were placed in this encounter.   Medications Discontinued During This Encounter  Medication Reason   hydrochlorothiazide (HYDRODIURIL) 25 MG tablet Discontinued by provider   ibuprofen (ADVIL,MOTRIN) 200 MG tablet Patient Preference   valACYclovir (VALTREX) 1000 MG tablet Completed Course     Current Outpatient Medications:    albuterol (PROVENTIL HFA;VENTOLIN HFA) 108 (90 BASE) MCG/ACT inhaler, Inhale 2 puffs into the lungs every 6 (six) hours as needed. For cough or shortness of breath, Disp: , Rfl:    allopurinol (ZYLOPRIM) 300 MG tablet, , Disp: , Rfl:    esomeprazole (NEXIUM) 40 MG capsule, Take 40 mg by mouth daily., Disp: , Rfl:    fluticasone (FLONASE) 50 MCG/ACT nasal spray, Place 2 sprays into the nose daily., Disp: , Rfl:    Fluticasone-Salmeterol (ADVAIR) 100-50 MCG/DOSE AEPB, Inhale 1 puff into the lungs every 12 (twelve) hours., Disp: , Rfl:    Glucosamine-Chondroitin (GLUCOSAMINE CHONDR COMPLEX PO), Take 2 tablets by mouth at bedtime., Disp: , Rfl:    JARDIANCE 10 MG TABS tablet, Take 10 mg by mouth daily., Disp: , Rfl:    levalbuterol (XOPENEX HFA) 45 MCG/ACT inhaler, Inhale 2 puffs into the lungs every 4 (four) hours as needed., Disp: , Rfl:    lisinopril (ZESTRIL) 40 MG tablet, Take 40 mg by mouth daily., Disp: , Rfl:    meloxicam (MOBIC) 15 MG tablet, TAKE 1 TABLET BY MOUTH EVERY DAY, Disp: , Rfl:    metFORMIN (GLUCOPHAGE-XR) 500 MG 24 hr tablet, TAKE 1 TABLET BY MOUTH EVERY DAY IN THE EVENING WITH DINNER, Disp: , Rfl:    tadalafil (CIALIS) 10 MG tablet, TAKE 1 TO 2 TABLETS 1 HR PRIOR TO SEXUAL INTERCOURSE TABLET ONCE A DAY(MAX '20MG'$  IN 24HRS ORALLY, Disp: , Rfl:   No orders of the defined types were placed in this encounter.   There are no Patient Instructions on file for this visit.   --Continue cardiac medications as reconciled in final medication list. --Return in about 3 years (around 05/13/2025) for Follow up aortic valve  sclerosis. or sooner if needed. --Continue follow-up with your primary care physician regarding the management of your other chronic comorbid conditions.  Patient's questions and concerns were addressed to his satisfaction. He voices understanding of the instructions provided during this encounter.   This note was created using a voice recognition software as a result there may be grammatical errors inadvertently enclosed that do not reflect the nature of this encounter. Every attempt is made to correct such errors.  Luretta Everly Cullison,  DO, [facility]  Pager: [phone] Office: [phone]

## 2023-02-12 IMAGING — CT CT CARDIAC CORONARY ARTERY CALCIUM SCORE
3 series · 14 of 20 positions shown, 16 images · non-contrast
Comparison: None.
COMPARISON: None.

Addendum:
EXAM:
OVER-READ INTERPRETATION  CT CHEST

The following report is an over-read performed by radiologist Dr.
Rigo Sylvester [REDACTED] on 10/07/2020. This
over-read does not include interpretation of cardiac or coronary
anatomy or pathology. The coronary calcium score interpretation by
the cardiologist is attached.
CLINICAL DATA: Cardiovascular disease risk stratification
CT Coronary Calcium Score
TECHNIQUE: A gated, non-contrast computed tomography scan of the heart was
performed using 3mm slice thickness. Axial images were analyzed on a
dedicated workstation. Calcium scoring of the coronary arteries was
performed using the Agatston method.

[Series 2: casc 3.0 best diast 37 % (id) · axial · 0.39mm/px · z∈[+1250,+1331]mm · 4 of 47 slices shown]
[im 10/47  vessel]
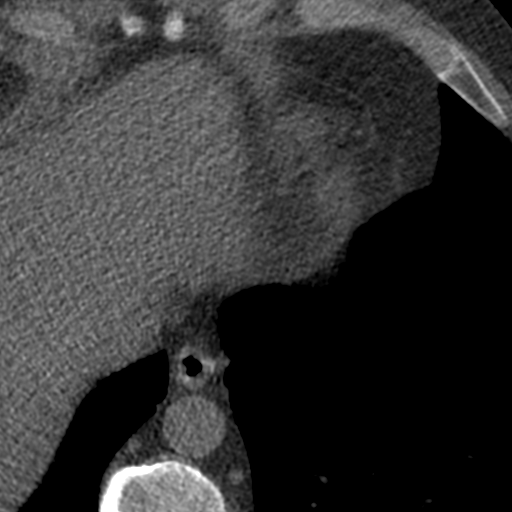
[im 19/47  vessel]
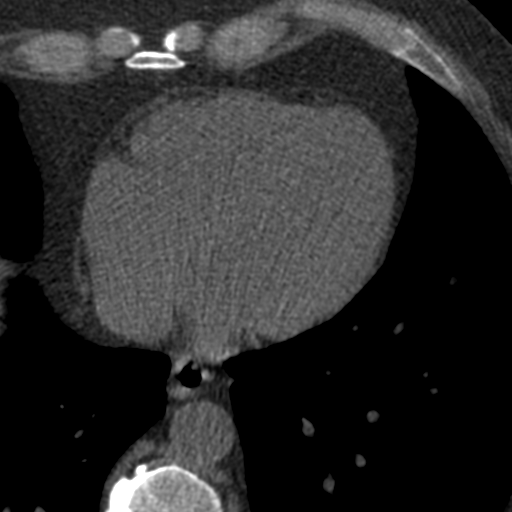
[im 28/47  vessel]
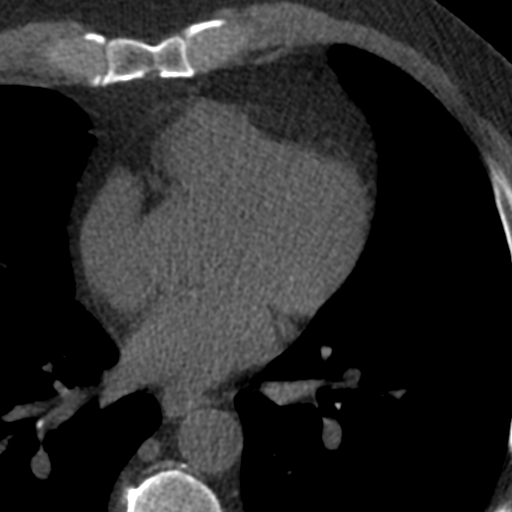
[im 37/47  vessel]
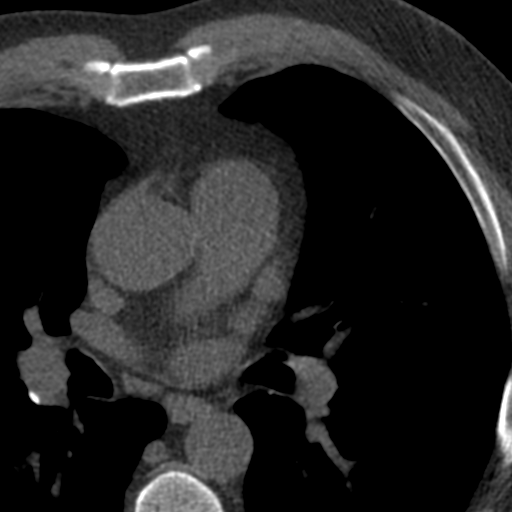

[Series 3: soft full fov 38 % · axial · 0.72mm/px · z∈[+1244,+1337]mm · 5 of 47 slices shown, 7 images]
[im 8/47  vessel]
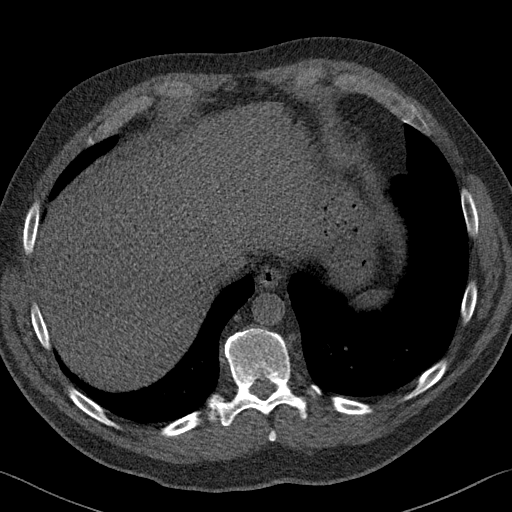
[im 8/47  lung]
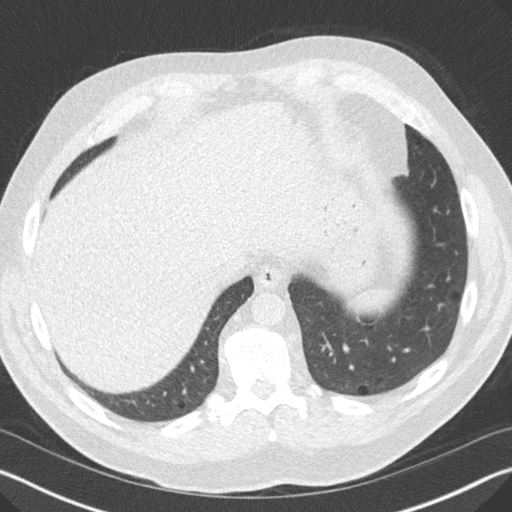
[im 16/47  vessel]
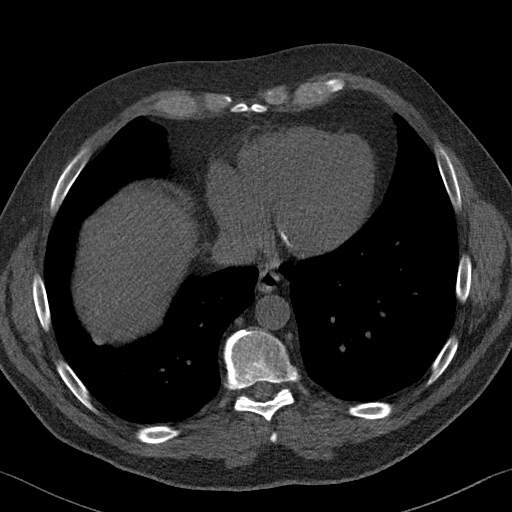
[im 24/47  vessel]
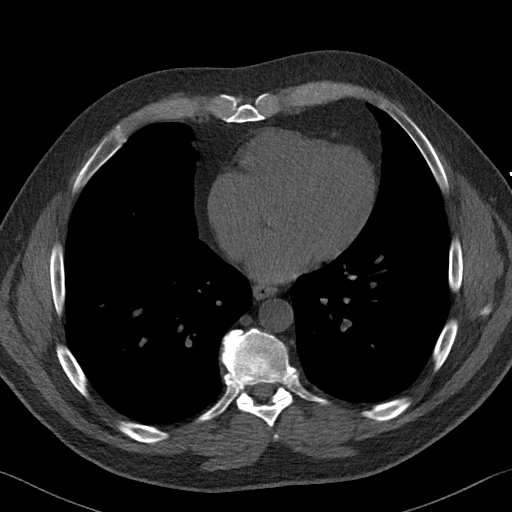
[im 31/47  vessel]
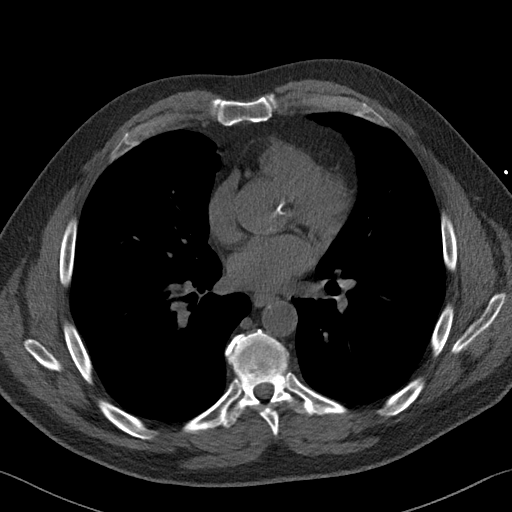
[im 39/47  vessel]
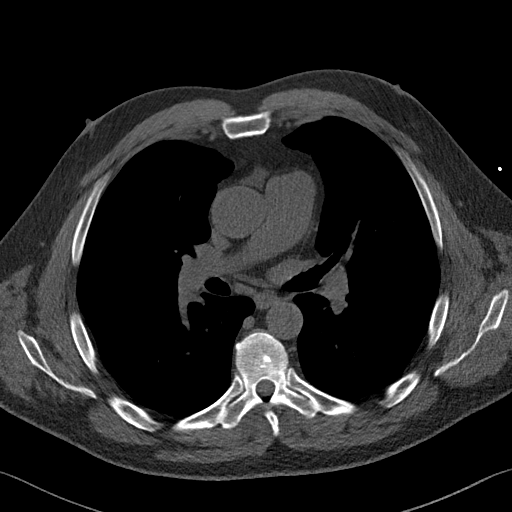
[im 39/47  lung]
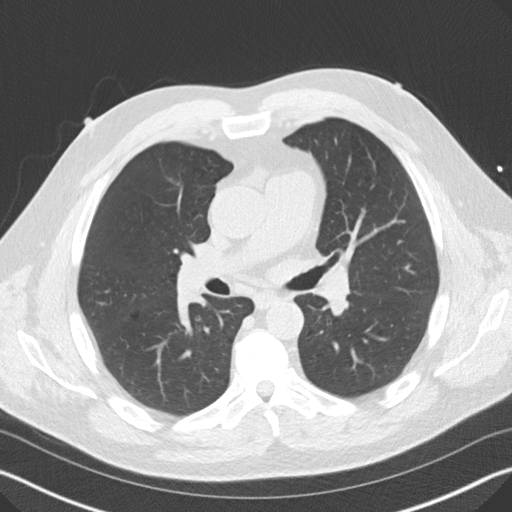

[Series 4: lungs 38 % · axial · 0.72mm/px · z∈[+1244,+1337]mm · 5 of 47 slices shown]
[im 8/47  vessel]
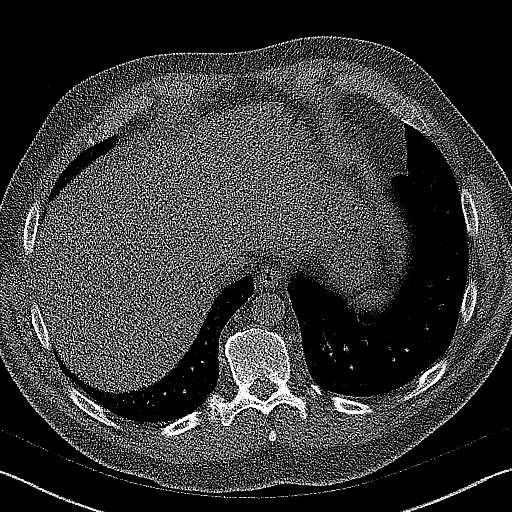
[im 16/47  vessel]
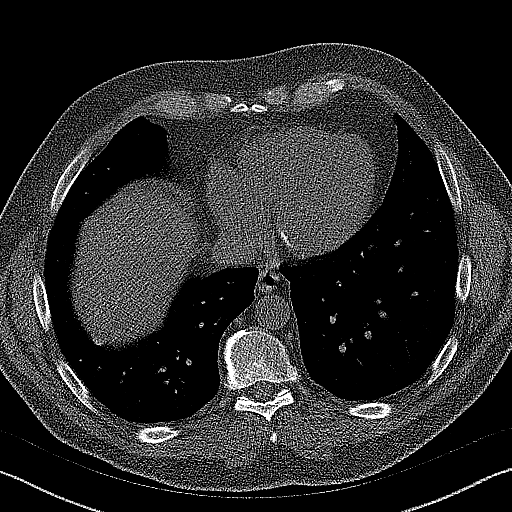
[im 24/47  vessel]
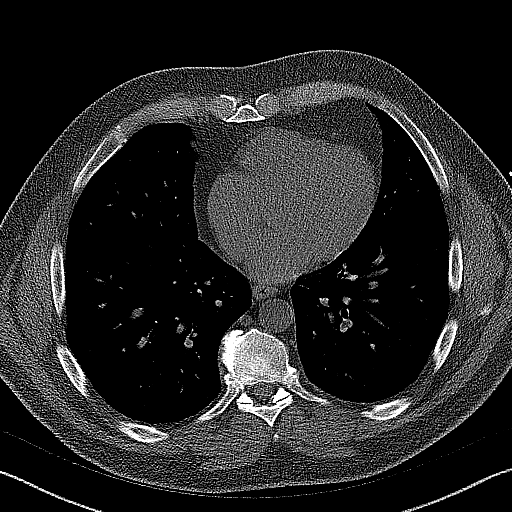
[im 31/47  vessel]
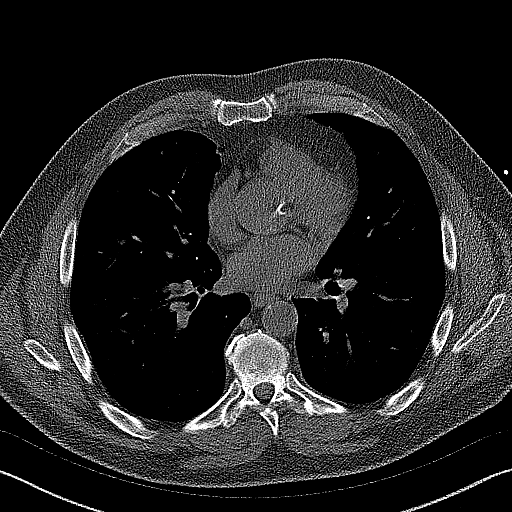
[im 39/47  vessel]
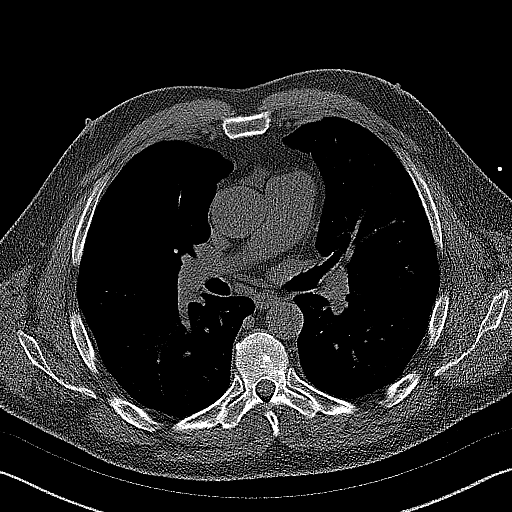

[14 of 20 positions shown; findings below may reference images not displayed]

FINDINGS: Calcified granuloma in the right lower lobe at the lung base. Within
the visualized portions of the thorax there are no suspicious
appearing pulmonary nodules or masses, there is no acute
consolidative airspace disease, no pleural effusions, no
pneumothorax and no lymphadenopathy. Visualized portions of the
upper abdomen are unremarkable. There are no aggressive appearing
lytic or blastic lesions noted in the visualized portions of the
skeleton.
IMPRESSION: No significant incidental noncardiac findings are noted.
FINDINGS: Coronary arteries: Normal origins.

Coronary Calcium Score:

Left main: 0

Left anterior descending artery: 1

Left circumflex artery: 0

Right coronary artery: 0

Total: 1

Percentile: 20th

Pericardium: Normal.

Ascending Aorta: Ascending aorta measures approximately 38mm at the
mid ascending aorta measured in an axial plane. BSA not available
for indexed measurements.

Non-cardiac: See separate report from [REDACTED].

Aortic valve and mitral annular calcifications noted.

AV calcium score: 458
IMPRESSION: 1. Coronary calcium score of 1. This was 20th percentile for age-,
race-, and sex-matched controls.

2.  AV calcium score: 458



If CAC=0, it is reasonable to withhold statin therapy and reassess
in 5 to 10 years, as long as higher risk conditions are absent
(diabetes mellitus, family history of premature CHD in first degree
relatives (males <55 years; females <65 years), cigarette smoking,
or LDL >=190 mg/dL).

If CAC is 1 to 99, it is reasonable to initiate statin therapy for
patients >=55 years of age.

If CAC is >=100 or >=75th percentile, it is reasonable to initiate
statin therapy at any age.

Cardiology referral should be considered for patients with CAC
scores >=400 or >=75th percentile.

*8218 AHA/ACC/AACVPR/AAPA/ABC/JORGE ARMANDO/MUJTABA/AKACH/Ana Lilia/SHRINE/SHADE/NOSA
Guideline on the Management of Blood Cholesterol: A Report of the
American College of Cardiology/American Heart Association Task Force
on Clinical Practice Guidelines. J Am Coll Cardiol.
0115;73(24):4438-4651.

*** End of Addendum ***
EXAM:
OVER-READ INTERPRETATION  CT CHEST

The following report is an over-read performed by radiologist Dr.
Rigo Sylvester [REDACTED] on 10/07/2020. This
over-read does not include interpretation of cardiac or coronary
anatomy or pathology. The coronary calcium score interpretation by
the cardiologist is attached.
FINDINGS: Calcified granuloma in the right lower lobe at the lung base. Within
the visualized portions of the thorax there are no suspicious
appearing pulmonary nodules or masses, there is no acute
consolidative airspace disease, no pleural effusions, no
pneumothorax and no lymphadenopathy. Visualized portions of the
upper abdomen are unremarkable. There are no aggressive appearing
lytic or blastic lesions noted in the visualized portions of the
skeleton.
IMPRESSION: No significant incidental noncardiac findings are noted.
